# Patient Record
Sex: Male | Born: 2000 | Race: Black or African American | Hispanic: No | Marital: Single | Smoking: Never smoker
Health system: Southern US, Community
[De-identification: ages and names within clinical notes are randomized; demographics above are authoritative.]

## PROBLEM LIST (undated history)

## (undated) DIAGNOSIS — I10 Essential (primary) hypertension: Secondary | ICD-10-CM

## (undated) DIAGNOSIS — E669 Obesity, unspecified: Secondary | ICD-10-CM

---
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
## 2019-11-26 ENCOUNTER — Inpatient Hospital Stay
Admission: EM | Admit: 2019-11-26 | Discharge: 2019-11-28 | DRG: 177 | Disposition: A | Discharge status: Home / Self Care | Payer: Medicaid Other | Attending: Internal Medicine | Admitting: Internal Medicine

## 2019-11-26 ENCOUNTER — Emergency Department: Payer: Medicaid Other

## 2019-11-26 ENCOUNTER — Other Ambulatory Visit: Payer: Self-pay

## 2019-11-26 ENCOUNTER — Encounter: Payer: Self-pay | Admitting: Emergency Medicine

## 2019-11-26 DIAGNOSIS — J9601 Acute respiratory failure with hypoxia: Secondary | ICD-10-CM | POA: Diagnosis present

## 2019-11-26 DIAGNOSIS — J9600 Acute respiratory failure, unspecified whether with hypoxia or hypercapnia: Secondary | ICD-10-CM

## 2019-11-26 DIAGNOSIS — U071 COVID-19: Principal | ICD-10-CM

## 2019-11-26 DIAGNOSIS — D6959 Other secondary thrombocytopenia: Secondary | ICD-10-CM | POA: Diagnosis present

## 2019-11-26 DIAGNOSIS — J1282 Pneumonia due to coronavirus disease 2019: Secondary | ICD-10-CM | POA: Diagnosis present

## 2019-11-26 DIAGNOSIS — E876 Hypokalemia: Secondary | ICD-10-CM | POA: Diagnosis present

## 2019-11-26 DIAGNOSIS — Z6841 Body Mass Index (BMI) 40.0 and over, adult: Secondary | ICD-10-CM

## 2019-11-26 DIAGNOSIS — E669 Obesity, unspecified: Secondary | ICD-10-CM

## 2019-11-26 HISTORY — DX: Essential (primary) hypertension: I10

## 2019-11-26 MED ORDER — SODIUM CHLORIDE 0.9 % IV BOLUS
1000.0000 mL | Freq: Once | INTRAVENOUS | Status: AC
  Administered 2019-11-27: 1000 mL via INTRAVENOUS

## 2019-11-26 MED ORDER — DEXAMETHASONE SODIUM PHOSPHATE 10 MG/ML IJ SOLN
10.0000 mg | Freq: Once | INTRAMUSCULAR | Status: AC
  Administered 2019-11-27: 10 mg via INTRAVENOUS
  Filled 2019-11-26: qty 1

## 2019-11-26 MED ORDER — ONDANSETRON HCL 4 MG/2ML IJ SOLN
4.0000 mg | Freq: Once | INTRAMUSCULAR | Status: AC
  Administered 2019-11-27: 4 mg via INTRAVENOUS
  Filled 2019-11-26: qty 2

## 2019-11-26 NOTE — ED Provider Notes (Signed)
[facility] Emergency Department Provider Note  ____________________________________________   First MD Initiated Contact with Patient 11/26/19 2315     (approximate)  I have reviewed the triage vital signs and the nursing notes.   HISTORY  Chief Complaint Shortness of Breath   HPI Brian Mcknight is a 19 y.o. male with history of hypertension and recently diagnosed COVID-19 infection on 11/23/2019 presents to the emergency department via EMS secondary to progressive dyspnea fever vomiting diarrhea.  EMS states that the patient's oxygen saturation was 97% on room air however that the patient was markedly tachypneic on their arrival.  Patient's amatory saturation 7782% on room air with markedly increased tachypnea.        Past Medical History:  Diagnosis Date  . Hypertension     There are no problems to display for this patient.   Past Surgical History:  Procedure Laterality Date  . FACIAL COSMETIC SURGERY  January 04, 1999   pt st he had a bump on his nose when he was born that was removed    Prior to Admission medications   Not on File    Allergies Patient has no allergy information on record.  History reviewed. No pertinent family history.  Social History Social History   Tobacco Use  . Smoking status: Never Smoker  . Smokeless tobacco: Never Used  Substance Use Topics  . Alcohol use: Never  . Drug use: Never    Review of Systems Constitutional: No fever/chills Eyes: No visual changes. ENT: No sore throat. Cardiovascular: Denies chest pain. Respiratory: Denies shortness of breath. Gastrointestinal: No abdominal pain.  No nausea, no vomiting.  No diarrhea.  No constipation. Genitourinary: Negative for dysuria. Musculoskeletal: Negative for neck pain.  Negative for back pain. Integumentary: Negative for rash. Neurological: Negative for headaches, focal weakness or  numbness.  ____________________________________________   PHYSICAL EXAM:  VITAL SIGNS: ED Triage Vitals  Enc Vitals Group     BP 11/26/19 2303 (!) 109/55     Pulse Rate 11/26/19 2303 (!) 102     Resp 11/26/19 2303 (!) 26     Temp 11/26/19 2303 99.8 F (37.7 C)     Temp Source 11/26/19 2303 Oral     SpO2 11/26/19 2256 100 %     Weight 11/26/19 2304 (!) 172.4 kg (380 lb)     Height 11/26/19 2304 1.829 m (6')     Head Circumference --      Peak Flow --      Pain Score 11/26/19 2303 8     Pain Loc --      Pain Edu? --      Excl. in [place]? --     Constitutional: Alert and oriented.  Apparent respiratory difficulty Eyes: Conjunctivae are normal.  Head: Atraumatic. Mouth/Throat: Patient is wearing a mask. Neck: No stridor.  No meningeal signs.   Cardiovascular: Normal rate, regular rhythm. Good peripheral circulation. Grossly normal heart sounds. Respiratory: Tachypnea.  No retractions.  Diffuse rhonchi  gastrointestinal: Soft and nontender. No distention.  Musculoskeletal: No lower extremity tenderness nor edema. No gross deformities of extremities. Neurologic:  Normal speech and language. No gross focal neurologic deficits are appreciated.  Skin:  Skin is warm, dry and intact. Psychiatric: Mood and affect are normal. Speech and behavior are normal.  ____________________________________________   LABS (all labs ordered are listed, but only abnormal results are displayed)  Labs Reviewed  CULTURE, BLOOD (ROUTINE X 2)  CULTURE, BLOOD (ROUTINE X 2)  LACTIC ACID, PLASMA  LACTIC ACID, PLASMA  CBC WITH DIFFERENTIAL/PLATELET  COMPREHENSIVE METABOLIC PANEL  FIBRIN DERIVATIVES D-DIMER ([facility] ONLY)  PROCALCITONIN  LACTATE DEHYDROGENASE  FERRITIN  TRIGLYCERIDES  FIBRINOGEN  C-REACTIVE PROTEIN   ____________________________________________  EKG  ED ECG REPORT I, [place] N Ivalee Strauser, the attending physician, personally viewed and interpreted this ECG.   Date: 11/27/2019  EKG  Time: 12:18 AM  Rate: 100  Rhythm: Normal sinus rhythm  Axis: Normal  Intervals: Normal  ST&T Change: None  ____________________________________________  RADIOLOGY I, [place] Felicity Penix, personally viewed and evaluated these images (plain radiographs) as part of my medical decision making, as well as reviewing the written report by the radiologist.  ED MD interpretation: No acute cardiopulmonary process noted on chest x-ray per radiologist  Official radiology report(s): DG Chest Port 1 View  Result Date: 11/26/2019 CLINICAL DATA:  19 year old male with cough and shortness of breath. EXAM: PORTABLE CHEST 1 VIEW COMPARISON:  None. FINDINGS: No focal consolidation, pleural effusion, or pneumothorax. There is shallow inspiration. Borderline cardiomegaly. No acute osseous pathology. IMPRESSION: No acute cardiopulmonary process. Electronically Signed   By: Elgie Collard M.D.   On: 11/26/2019 23:38    ____________________________________________   PROCEDURES   Procedure(s) performed (including Critical Care):  .Critical Care Performed by: Darci Current, MD Authorized by: Darci Current, MD   Critical care provider statement:    Critical care time (minutes):  30   Critical care time was exclusive of:  Separately billable procedures and treating other patients   Critical care was necessary to treat or prevent imminent or life-threatening deterioration of the following conditions:  Respiratory failure   Critical care was time spent personally by me on the following activities:  Development of treatment plan with patient or surrogate, discussions with consultants, evaluation of patient's response to treatment, examination of patient, obtaining history from patient or surrogate, ordering and performing treatments and interventions, ordering and review of laboratory studies, ordering and review of radiographic studies, pulse oximetry, re-evaluation of patient's condition and review of  old charts     ____________________________________________   INITIAL IMPRESSION / MDM / ASSESSMENT AND PLAN / ED COURSE  As part of my medical decision making, I reviewed the following data within the electronic MEDICAL RECORD NUMBER   19 year old male presented with above-stated history review of system with differential diagnosis including but not limited to COVID-19 infection, COVID-19 pneumonia, superimposed bacterial pneumonia.  Chest x-ray revealed no findings of pneumonia.  Given patient's marked hypoxia with minimal exertion.  Patient given Decadron 10 mg IV remdesivir consult placed.  Patient placed on 3 L oxygen via nasal cannula.  On reevaluation patient's work of breathing decreased resting comfortably.  Patient discussed with Dr. Para March hospitalist for admission for further evaluation and management. ____________________________________________  FINAL CLINICAL IMPRESSION(S) / ED DIAGNOSES  Final diagnoses:  COVID-19 virus infection     MEDICATIONS GIVEN DURING THIS VISIT:  Medications  sodium chloride 0.9 % bolus 1,000 mL (has no administration in time range)  dexamethasone (DECADRON) injection 10 mg (10 mg Intravenous Given 11/27/19 0021)  ondansetron (ZOFRAN) injection 4 mg (4 mg Intravenous Given 11/27/19 0021)     ED Discharge Orders    None      *Please note:  Zebulun Deman was evaluated in Emergency Department on 11/27/2019 for the symptoms described in the history of present illness. He was evaluated in the context of the global COVID-19 pandemic, which necessitated consideration that the patient might be at risk for infection with the SARS-CoV-2  virus that causes COVID-19. Institutional protocols and algorithms that pertain to the evaluation of patients at risk for COVID-19 are in a state of rapid change based on information released by regulatory bodies including the CDC and federal and state organizations. These policies and algorithms were followed during the  patient's care in the ED.  Some ED evaluations and interventions may be delayed as a result of limited staffing during the pandemic.*  Note:  This document was prepared using Dragon voice recognition software and may include unintentional dictation errors.   Darci Current, MD 11/27/19 [phone]

## 2019-11-26 NOTE — ED Notes (Signed)
Pt was ambulated with cardiac and pulse oximetry monitoring and de stated to 77-82% O2 on RA. EDP notified

## 2019-11-26 NOTE — ED Triage Notes (Addendum)
Pt from home to ED via [facility] for c/o [facility] r/t COVID dx. {Pt was dx w/ COVID 5/1 and has since had increasing dififculty while breathing. Pt able to speak in complete sentences but labored at rest with prolonged exhalation.   PT A&Ox4  T: 103.1 P:104 BP: 140/70 O2: 100 RA   975mg  of tylenol oral given en route  Pmhx: htn( Pt takes amlodipine but has stopped since being dx with COVID - st "I feel too bad to take it" )

## 2019-11-26 NOTE — ED Notes (Signed)
EDP at bedside  

## 2019-11-27 DIAGNOSIS — J9600 Acute respiratory failure, unspecified whether with hypoxia or hypercapnia: Secondary | ICD-10-CM

## 2019-11-27 DIAGNOSIS — R0600 Dyspnea, unspecified: Secondary | ICD-10-CM | POA: Diagnosis present

## 2019-11-27 DIAGNOSIS — J9601 Acute respiratory failure with hypoxia: Secondary | ICD-10-CM | POA: Diagnosis present

## 2019-11-27 DIAGNOSIS — D6959 Other secondary thrombocytopenia: Secondary | ICD-10-CM | POA: Diagnosis present

## 2019-11-27 DIAGNOSIS — Z6841 Body Mass Index (BMI) 40.0 and over, adult: Secondary | ICD-10-CM

## 2019-11-27 DIAGNOSIS — J1282 Pneumonia due to coronavirus disease 2019: Secondary | ICD-10-CM | POA: Diagnosis present

## 2019-11-27 DIAGNOSIS — U071 COVID-19: Principal | ICD-10-CM

## 2019-11-27 DIAGNOSIS — D696 Thrombocytopenia, unspecified: Secondary | ICD-10-CM

## 2019-11-27 DIAGNOSIS — E876 Hypokalemia: Secondary | ICD-10-CM | POA: Diagnosis present

## 2019-11-27 LAB — LACTATE DEHYDROGENASE: LDH: 239 U/L — ABNORMAL HIGH (ref 98–192)

## 2019-11-27 LAB — COMPREHENSIVE METABOLIC PANEL
ALT: 48 U/L — ABNORMAL HIGH (ref 0–44)
AST: 31 U/L (ref 15–41)
Albumin: 4.2 g/dL (ref 3.5–5.0)
Alkaline Phosphatase: 43 U/L (ref 38–126)
Anion gap: 9 (ref 5–15)
BUN: 12 mg/dL (ref 6–20)
CO2: 23 mmol/L (ref 22–32)
Calcium: 8.9 mg/dL (ref 8.9–10.3)
Chloride: 102 mmol/L (ref 98–111)
Creatinine, Ser: 1.09 mg/dL (ref 0.61–1.24)
GFR calc Af Amer: >60 mL/min (ref >60)
GFR calc non Af Amer: >60 mL/min (ref >60)
Glucose, Bld: 92 mg/dL (ref 70–99)
Potassium: 3.3 mmol/L — ABNORMAL LOW (ref 3.5–5.1)
Sodium: 134 mmol/L — ABNORMAL LOW (ref 135–145)
Total Bilirubin: 1.3 mg/dL — ABNORMAL HIGH (ref 0.3–1.2)
Total Protein: 8 g/dL (ref 6.5–8.1)

## 2019-11-27 LAB — CBC WITH DIFFERENTIAL/PLATELET
Abs Immature Granulocytes: 0.02 10*3/uL (ref 0.00–0.07)
Basophils Absolute: 0 10*3/uL (ref 0.0–0.1)
Basophils Relative: 1 %
Eosinophils Absolute: 0 10*3/uL (ref 0.0–0.5)
Eosinophils Relative: 0 %
HCT: 41.5 % (ref 39.0–52.0)
Hemoglobin: 14.5 g/dL (ref 13.0–17.0)
Immature Granulocytes: 1 %
Lymphocytes Relative: 30 %
Lymphs Abs: 1.3 10*3/uL (ref 0.7–4.0)
MCH: 31.3 pg (ref 26.0–34.0)
MCHC: 34.9 g/dL (ref 30.0–36.0)
MCV: 89.4 fL (ref 80.0–100.0)
Monocytes Absolute: 0.4 10*3/uL (ref 0.1–1.0)
Monocytes Relative: 9 %
Neutro Abs: 2.6 10*3/uL (ref 1.7–7.7)
Neutrophils Relative %: 59 %
Platelets: 116 10*3/uL — ABNORMAL LOW (ref 150–400)
RBC: 4.64 MIL/uL (ref 4.22–5.81)
RDW: 12.1 % (ref 11.5–15.5)
WBC: 4.3 10*3/uL (ref 4.0–10.5)
nRBC: 0 % (ref 0.0–0.2)

## 2019-11-27 LAB — C-REACTIVE PROTEIN: CRP: 1.4 mg/dL — ABNORMAL HIGH (ref <1.0)

## 2019-11-27 LAB — LACTIC ACID, PLASMA
Lactic Acid, Venous: 0.8 mmol/L (ref 0.5–1.9)
Lactic Acid, Venous: 1 mmol/L (ref 0.5–1.9)

## 2019-11-27 LAB — FERRITIN: Ferritin: 161 ng/mL (ref 24–336)

## 2019-11-27 LAB — TRIGLYCERIDES: Triglycerides: 61 mg/dL (ref <150)

## 2019-11-27 LAB — ABO/RH: ABO/RH(D): A POS

## 2019-11-27 LAB — FIBRIN DERIVATIVES D-DIMER (ARMC ONLY): Fibrin derivatives D-dimer (ARMC): 540.55 ng/mL (FEU) — ABNORMAL HIGH (ref 0.00–499.00)

## 2019-11-27 LAB — FIBRINOGEN: Fibrinogen: 390 mg/dL (ref 210–475)

## 2019-11-27 LAB — PROCALCITONIN: Procalcitonin: <0.1 ng/mL

## 2019-11-27 LAB — POC SARS CORONAVIRUS 2 AG: SARS Coronavirus 2 Ag: POSITIVE — AB

## 2019-11-27 MED ORDER — DEXAMETHASONE SODIUM PHOSPHATE 10 MG/ML IJ SOLN
6.0000 mg | INTRAMUSCULAR | Status: DC
  Administered 2019-11-27 – 2019-11-28 (×2): 6 mg via INTRAVENOUS
  Filled 2019-11-27 (×3): qty 1

## 2019-11-27 MED ORDER — SODIUM CHLORIDE 0.9 % IV SOLN
200.0000 mg | Freq: Once | INTRAVENOUS | Status: AC
  Administered 2019-11-27: 200 mg via INTRAVENOUS
  Filled 2019-11-27: qty 40

## 2019-11-27 MED ORDER — ZINC SULFATE 220 (50 ZN) MG PO CAPS
220.0000 mg | ORAL_CAPSULE | Freq: Every day | ORAL | Status: DC
  Administered 2019-11-27 – 2019-11-28 (×2): 220 mg via ORAL
  Filled 2019-11-27 (×2): qty 1

## 2019-11-27 MED ORDER — POTASSIUM CHLORIDE CRYS ER 20 MEQ PO TBCR
20.0000 meq | EXTENDED_RELEASE_TABLET | Freq: Once | ORAL | Status: AC
  Administered 2019-11-27: 20 meq via ORAL
  Filled 2019-11-27: qty 1

## 2019-11-27 MED ORDER — GUAIFENESIN-DM 100-10 MG/5ML PO SYRP
10.0000 mL | ORAL_SOLUTION | ORAL | Status: DC | PRN
  Filled 2019-11-27: qty 10

## 2019-11-27 MED ORDER — ASCORBIC ACID 500 MG PO TABS
500.0000 mg | ORAL_TABLET | Freq: Every day | ORAL | Status: DC
  Administered 2019-11-27 – 2019-11-28 (×2): 500 mg via ORAL
  Filled 2019-11-27 (×2): qty 1

## 2019-11-27 MED ORDER — ALBUTEROL SULFATE HFA 108 (90 BASE) MCG/ACT IN AERS
2.0000 | INHALATION_SPRAY | Freq: Four times a day (QID) | RESPIRATORY_TRACT | Status: DC
  Administered 2019-11-27 – 2019-11-28 (×6): 2 via RESPIRATORY_TRACT
  Filled 2019-11-27: qty 6.7

## 2019-11-27 MED ORDER — HYDROCOD POLST-CPM POLST ER 10-8 MG/5ML PO SUER
5.0000 mL | Freq: Two times a day (BID) | ORAL | Status: DC | PRN
  Administered 2019-11-27: 5 mL via ORAL
  Filled 2019-11-27: qty 5

## 2019-11-27 MED ORDER — SODIUM CHLORIDE 0.9 % IV SOLN
100.0000 mg | Freq: Every day | INTRAVENOUS | Status: DC
  Filled 2019-11-27: qty 20

## 2019-11-27 MED ORDER — ENOXAPARIN SODIUM 40 MG/0.4ML ~~LOC~~ SOLN
40.0000 mg | Freq: Two times a day (BID) | SUBCUTANEOUS | Status: DC
  Administered 2019-11-27 – 2019-11-28 (×3): 40 mg via SUBCUTANEOUS
  Filled 2019-11-27 (×4): qty 0.4

## 2019-11-27 MED ORDER — ADULT MULTIVITAMIN W/MINERALS CH
1.0000 | ORAL_TABLET | Freq: Every day | ORAL | Status: DC
  Administered 2019-11-27 – 2019-11-28 (×2): 1 via ORAL
  Filled 2019-11-27 (×2): qty 1

## 2019-11-27 NOTE — H&P (Signed)
History and Physical    Brian Mcknight TOI:[id number] DOB: 2000-09-11 DOA: 11/26/2019  PCP: System, Pcp Not In   Patient coming from: home I have personally briefly reviewed patient's old medical records in [facility]  Chief Complaint: shortness of breath  HPI: Brian Mcknight is a 19 y.o. male with medical history significant for morbid obesity, diagnosed with Covid on 11/23/2019 who presents to the emergency room with a complaint of  dyspnea with exertion.  Patient states he started feeling unwell about with a cough and shortness of breath and went to get tested.  The test was positive but he states he had minimal symptoms at the time.  He said he started developing shortness of breath just after he was tested positive in the past progress since.  He also developed a fever and cough 1 day.  EMS reported a temperature of 103.1 and he received IV Tylenol  ED Course: On arrival he had a low-grade temperature of 99.8 tachycardia of 102, tachypnea of 26.  O2 sat 97% on room air at rest but desaturates to 77 with minimal exertion like getting off the bed.  D-dimer elevated at 540 and LDH 239.  CBC pending at admission.  Lactic acid 0.8.  Chest x-ray showed no acute disease.  Patient started on Decadron and hospitalist consulted.   Review of Systems: As per HPI otherwise 10 point review of systems negative.    Past Medical History:  Diagnosis Date  . Hypertension     Past Surgical History:  Procedure Laterality Date  . FACIAL COSMETIC SURGERY  1998-12-25   pt st he had a bump on his nose when he was born that was removed     reports that he has never smoked. He has never used smokeless tobacco. He reports that he does not drink alcohol or use drugs.  Not on File  History reviewed. No pertinent family history.   Prior to Admission medications   Not on File    Physical Exam: Vitals:   11/26/19 2256 11/26/19 2302 11/26/19 2303 11/26/19 2304  BP:   (!) 109/55   Pulse:   (!) 102     Resp:   (!) 26   Temp:   99.8 F (37.7 C)   TempSrc:   Oral   SpO2: 100% 96% 97%   Weight:    (!) 172.4 kg  Height:    6' (1.829 m)     Vitals:   11/26/19 2256 11/26/19 2302 11/26/19 2303 11/26/19 2304  BP:   (!) 109/55   Pulse:   (!) 102   Resp:   (!) 26   Temp:   99.8 F (37.7 C)   TempSrc:   Oral   SpO2: 100% 96% 97%   Weight:    (!) 172.4 kg  Height:    6' (1.829 m)    Constitutional: Alert and awake, oriented x3, not in any acute distress. Eyes: PERLA, EOMI, irises appear normal, anicteric sclera,  ENMT: external ears and nose appear normal, normal hearing             Lips appears normal, oropharynx mucosa, tongue, posterior pharynx appear normal  Neck: neck appears normal, no masses, normal ROM, no thyromegaly, no JVD  CVS: S1-S2 clear, no murmur rubs or gallops,  , no carotid bruits, pedal pulses palpable, No LE edema Respiratory:  clear to auscultation bilaterally, no wheezing, rales or rhonchi. Respiratory effort normal. No accessory muscle use.  Abdomen: soft nontender, nondistended, normal bowel  sounds, no hepatosplenomegaly, no hernias Musculoskeletal: : no cyanosis, clubbing , no contractures or atrophy Neuro: Cranial nerves II-XII intact, sensation, reflexes normal, strength Psych: judgement and insight appear normal, stable mood and affect,  Skin: no rashes or lesions or ulcers, no induration or nodules   Labs on Admission: I have personally reviewed following labs and imaging studies  CBC: No results for input(s): WBC, NEUTROABS, HGB, HCT, MCV, PLT in the last 168 hours. Basic Metabolic Panel: Recent Labs  Lab 11/27/19 0004  NA 134*  K 3.3*  CL 102  CO2 23  GLUCOSE 92  BUN 12  CREATININE 1.09  CALCIUM 8.9   GFR: Estimated Creatinine Clearance: 176.6 mL/min (by C-G formula based on SCr of 1.09 mg/dL). Liver Function Tests: Recent Labs  Lab 11/27/19 0004  AST 31  ALT 48*  ALKPHOS 43  BILITOT 1.3*  PROT 8.0  ALBUMIN 4.2   No results  for input(s): LIPASE, AMYLASE in the last 168 hours. No results for input(s): AMMONIA in the last 168 hours. Coagulation Profile: No results for input(s): INR, PROTIME in the last 168 hours. Cardiac Enzymes: No results for input(s): CKTOTAL, CKMB, CKMBINDEX, TROPONINI in the last 168 hours. BNP (last 3 results) No results for input(s): PROBNP in the last 8760 hours. HbA1C: No results for input(s): HGBA1C in the last 72 hours. CBG: No results for input(s): GLUCAP in the last 168 hours. Lipid Profile: Recent Labs    11/27/19 0004  TRIG 61   Thyroid Function Tests: No results for input(s): TSH, T4TOTAL, FREET4, T3FREE, THYROIDAB in the last 72 hours. Anemia Panel: Recent Labs    11/27/19 0004  FERRITIN 161   Urine analysis: No results found for: COLORURINE, APPEARANCEUR, LABSPEC, PHURINE, GLUCOSEU, HGBUR, BILIRUBINUR, KETONESUR, PROTEINUR, UROBILINOGEN, NITRITE, LEUKOCYTESUR  Radiological Exams on Admission: DG Chest Port 1 View  Result Date: 11/26/2019 CLINICAL DATA:  19 year old male with cough and shortness of breath. EXAM: PORTABLE CHEST 1 VIEW COMPARISON:  None. FINDINGS: No focal consolidation, pleural effusion, or pneumothorax. There is shallow inspiration. Borderline cardiomegaly. No acute osseous pathology. IMPRESSION: No acute cardiopulmonary process. Electronically Signed   By: Elgie Collard M.D.   On: 11/26/2019 23:38    EKG: Independently reviewed.   Assessment/Plan Active Problems:   Acute respiratory failure due to COVID-19 [facility]) Suspect COVID-19 pneumonia -Diagnosed with COVID-19 on 11/23/2019 at a [facility].   -Presents with shortness of breath, fever and cough.  O2 sat dropped to the 70s with ambulation in the ER with elevated D-dimer 549 LDH 239.  Chest x-ray with no acute disease -patient is symptomatic for pneumonia though no disease on CXR -IV Decadron, remdesivir, albuterol inhaler, antitussives and vitamins. -Proning per protocol if  tolerated -Supplemental oxygen to keep sats over 94%    Morbid obesity with BMI of 50.0-59.9, adult (HCC) -Complicates overall prognosis and care    DVT prophylaxis: Lovenox  Code Status: full code  Family Communication:  none  Disposition Plan: Back to previous home environment Consults called: none  Status:inp   The appropriate patient status for this patient is INPATIENT. Inpatient status is judged to be reasonable and necessary in order to provide the required intensity of service to ensure the patient's safety. The patient's presenting symptoms, physical exam findings, and initial radiographic and laboratory data in the context of their chronic comorbidities is felt to place them at high risk for further clinical deterioration. Furthermore, it is not anticipated that the patient will be medically stable for discharge from the  hospital within 2 midnights of admission. The following factors support the patient status of inpatient.    " Presenting symptoms: Shortness of breath with  Hypoxia, O2 sat in the 70s in the setting of Covid positivity"            Worrisome physical exam: Tachypnea, tachycardia and fever with O2 sats in the 70s.  " The chronic co-morbidities include: Morbid obesity with BMI of 51 negatively impacting his prognosis    * I certify that at the point of admission it is my clinical judgment that the patient will require inpatient hospital care spanning beyond 2 midnights from the point of admission due to high intensity of service,  risk for further deterioration and high frequency of surveillance required.     Athena Masse MD [facility]     11/27/2019, 2:34 AM

## 2019-11-27 NOTE — Progress Notes (Signed)
PROGRESS NOTE    Brian Mcknight  SFK:[id number] DOB: 2000-08-07 DOA: 11/26/2019 PCP: System, Pcp Not In     Assessment & Plan:   Active Problems:   Acute respiratory failure due to COVID-19 [facility])   Morbid obesity with BMI of 50.0-59.9, adult (HCC)   Acute respiratory failure with hypoxia (HCC)  COVID19 pneumonia: dx on 11/23/2019 at a [facility]. Continue IV Decadron, remdesivir,vitamin c & zinc. Continue on bronchodilators. Encourage incentive spirometry & flutter valve.   Morbid obesity:  BMI of 51.5. Complicates overall prognosis and care. Dietitian consulted. Exercise counseling   Hypokalemia: KCl repleted. Will continue to monitor  Thrombocytopenia: likely secondary to COVID19. Will continue to monitor    DVT prophylaxis: lovenox  Code Status: full  Family Communication:  Disposition Plan: likely will d/c back home    Consultants:     Procedures:   Antimicrobials:    Subjective: Pt c/o shortness of breath but improved from day prior.   Objective: Vitals:   11/27/19 0100 11/27/19 0200 11/27/19 0300 11/27/19 0645  BP:   (!) 144/75 (!) 106/57  Pulse: 94 83 77 72  Resp: 18 (!) 24 (!) 23 (!) 22  Temp:   98.7 F (37.1 C)   TempSrc:   Oral   SpO2: 99% 94% 95% 94%  Weight:      Height:       No intake or output data in the 24 hours ending 11/27/19 0852 Filed Weights   11/26/19 2304  Weight: (!) 172.4 kg    Examination:  General exam: Appears calm and comfortable  Respiratory system: diminished breath sounds b/l. No wheezes, rales Cardiovascular system: S1 & S2 +. No rubs, gallops or clicks. B/l LE edema Gastrointestinal system: Abdomen is obese, soft and nontender. Hypoactive bowel sounds heard. Central nervous system: Alert and oriented. Moves all 4 extremities  Psychiatry: Judgement and insight appear normal. Mood & affect appropriate.     Data Reviewed: I have personally reviewed following labs and imaging studies  CBC: Recent Labs  Lab  11/27/19 0004  WBC 4.3  NEUTROABS 2.6  HGB 14.5  HCT 41.5  MCV 89.4  PLT 116*   Basic Metabolic Panel: Recent Labs  Lab 11/27/19 0004  NA 134*  K 3.3*  CL 102  CO2 23  GLUCOSE 92  BUN 12  CREATININE 1.09  CALCIUM 8.9   GFR: Estimated Creatinine Clearance: 176.6 mL/min (by C-G formula based on SCr of 1.09 mg/dL). Liver Function Tests: Recent Labs  Lab 11/27/19 0004  AST 31  ALT 48*  ALKPHOS 43  BILITOT 1.3*  PROT 8.0  ALBUMIN 4.2   No results for input(s): LIPASE, AMYLASE in the last 168 hours. No results for input(s): AMMONIA in the last 168 hours. Coagulation Profile: No results for input(s): INR, PROTIME in the last 168 hours. Cardiac Enzymes: No results for input(s): CKTOTAL, CKMB, CKMBINDEX, TROPONINI in the last 168 hours. BNP (last 3 results) No results for input(s): PROBNP in the last 8760 hours. HbA1C: No results for input(s): HGBA1C in the last 72 hours. CBG: No results for input(s): GLUCAP in the last 168 hours. Lipid Profile: Recent Labs    11/27/19 0004  TRIG 61   Thyroid Function Tests: No results for input(s): TSH, T4TOTAL, FREET4, T3FREE, THYROIDAB in the last 72 hours. Anemia Panel: Recent Labs    11/27/19 0004  FERRITIN 161   Sepsis Labs: Recent Labs  Lab 11/27/19 0004 11/27/19 0046  PROCALCITON <0.10  --   LATICACIDVEN 1.0  0.8    Recent Results (from the past 240 hour(s))  Blood Culture (routine x 2)     Status: None (Preliminary result)   Collection Time: 11/27/19 12:04 AM   Specimen: BLOOD  Result Value Ref Range Status   Specimen Description BLOOD BLOOD RIGHT HAND  Final   Special Requests   Final    BOTTLES DRAWN AEROBIC AND ANAEROBIC Blood Culture adequate volume   Culture   Final    NO GROWTH < 12 HOURS Performed at [facility], [address] [place], [place] [address]    Report Status PENDING  Incomplete  Blood Culture (routine x 2)     Status: None (Preliminary result)   Collection Time:  11/27/19 12:04 AM   Specimen: Left Antecubital; Blood  Result Value Ref Range Status   Specimen Description LEFT ANTECUBITAL  Final   Special Requests   Final    BOTTLES DRAWN AEROBIC AND ANAEROBIC Blood Culture results may not be optimal due to an excessive volume of blood received in culture bottles   Culture   Final    NO GROWTH < 12 HOURS Performed at [facility], [address], [place], [place] [address]    Report Status PENDING  Incomplete         Radiology Studies: DG Chest Port 1 View  Result Date: 11/26/2019 CLINICAL DATA:  19 year old male with cough and shortness of breath. EXAM: PORTABLE CHEST 1 VIEW COMPARISON:  None. FINDINGS: No focal consolidation, pleural effusion, or pneumothorax. There is shallow inspiration. Borderline cardiomegaly. No acute osseous pathology. IMPRESSION: No acute cardiopulmonary process. Electronically Signed   By: Anner Crete M.D.   On: 11/26/2019 23:38        Scheduled Meds: . albuterol  2 puff Inhalation Q6H  . vitamin C  500 mg Oral Daily  . dexamethasone (DECADRON) injection  6 mg Intravenous Q24H  . enoxaparin (LOVENOX) injection  40 mg Subcutaneous BID  . multivitamin with minerals  1 tablet Oral Daily  . zinc sulfate  220 mg Oral Daily   Continuous Infusions: . [START ON 11/28/2019] remdesivir 100 mg in NS 100 mL       LOS: 0 days    Time spent: 35 mins     Wyvonnia Dusky, MD [facility] Pager [phone]  If 7PM-7AM, please contact night-coverage [email or web address] 11/27/2019, 8:52 AM

## 2019-11-27 NOTE — ED Notes (Signed)
Pt provided with breakfast

## 2019-11-27 NOTE — ED Notes (Signed)
MD at bedside. 

## 2019-11-27 NOTE — ED Notes (Signed)
Pt ambulatory to toilet independently w/a steady gait. NAD by this RN

## 2019-11-27 NOTE — Progress Notes (Signed)
Remdesivir - Pharmacy Brief Note   O:  CXR: IMPRESSION: No acute cardiopulmonary process. SpO2: 95 - 100% on RA   A/P:  Remdesivir 200 mg IVPB once followed by 100 mg IVPB daily x 4 days.   Thomasene Ripple, PharmD, BCPS Clinical Pharmacist 11/27/2019 3:44 AM

## 2019-11-27 NOTE — ED Notes (Signed)
Pt up independently to restroom. Pt endorses some mild SOB but much less than yesterday.

## 2019-11-28 LAB — COMPREHENSIVE METABOLIC PANEL
ALT: 40 U/L (ref 0–44)
AST: 21 U/L (ref 15–41)
Albumin: 4 g/dL (ref 3.5–5.0)
Alkaline Phosphatase: 43 U/L (ref 38–126)
Anion gap: 8 (ref 5–15)
BUN: 13 mg/dL (ref 6–20)
CO2: 26 mmol/L (ref 22–32)
Calcium: 8.9 mg/dL (ref 8.9–10.3)
Chloride: 104 mmol/L (ref 98–111)
Creatinine, Ser: 0.95 mg/dL (ref 0.61–1.24)
GFR calc Af Amer: >60 mL/min (ref >60)
GFR calc non Af Amer: >60 mL/min (ref >60)
Glucose, Bld: 113 mg/dL — ABNORMAL HIGH (ref 70–99)
Potassium: 4 mmol/L (ref 3.5–5.1)
Sodium: 138 mmol/L (ref 135–145)
Total Bilirubin: 1 mg/dL (ref 0.3–1.2)
Total Protein: 8.2 g/dL — ABNORMAL HIGH (ref 6.5–8.1)

## 2019-11-28 LAB — CBC WITH DIFFERENTIAL/PLATELET
Abs Immature Granulocytes: 0.03 10*3/uL (ref 0.00–0.07)
Basophils Absolute: 0 10*3/uL (ref 0.0–0.1)
Basophils Relative: 0 %
Eosinophils Absolute: 0 10*3/uL (ref 0.0–0.5)
Eosinophils Relative: 0 %
HCT: 42.3 % (ref 39.0–52.0)
Hemoglobin: 14.3 g/dL (ref 13.0–17.0)
Immature Granulocytes: 1 %
Lymphocytes Relative: 19 %
Lymphs Abs: 1.2 10*3/uL (ref 0.7–4.0)
MCH: 31.3 pg (ref 26.0–34.0)
MCHC: 33.8 g/dL (ref 30.0–36.0)
MCV: 92.6 fL (ref 80.0–100.0)
Monocytes Absolute: 0.4 10*3/uL (ref 0.1–1.0)
Monocytes Relative: 6 %
Neutro Abs: 4.9 10*3/uL (ref 1.7–7.7)
Neutrophils Relative %: 74 %
Platelets: 129 10*3/uL — ABNORMAL LOW (ref 150–400)
RBC: 4.57 MIL/uL (ref 4.22–5.81)
RDW: 11.9 % (ref 11.5–15.5)
WBC: 6.6 10*3/uL (ref 4.0–10.5)
nRBC: 0 % (ref 0.0–0.2)

## 2019-11-28 LAB — FERRITIN: Ferritin: 161 ng/mL (ref 24–336)

## 2019-11-28 LAB — C-REACTIVE PROTEIN: CRP: 1.1 mg/dL — ABNORMAL HIGH (ref <1.0)

## 2019-11-28 LAB — HIV ANTIBODY (ROUTINE TESTING W REFLEX): HIV Screen 4th Generation wRfx: NONREACTIVE

## 2019-11-28 LAB — FIBRIN DERIVATIVES D-DIMER (ARMC ONLY): Fibrin derivatives D-dimer (ARMC): 425.53 ng/mL (FEU) (ref 0.00–499.00)

## 2019-11-28 MED ORDER — ONDANSETRON HCL 4 MG/2ML IJ SOLN
4.0000 mg | Freq: Four times a day (QID) | INTRAMUSCULAR | Status: DC | PRN
  Administered 2019-11-28: 06:00:00 4 mg via INTRAVENOUS
  Filled 2019-11-28: qty 2

## 2019-11-28 MED ORDER — DEXAMETHASONE 6 MG PO TABS
6.0000 mg | ORAL_TABLET | Freq: Every day | ORAL | 0 refills | Status: AC
Start: 2019-11-28 — End: 2019-12-05

## 2019-11-28 MED ORDER — ALBUTEROL SULFATE HFA 108 (90 BASE) MCG/ACT IN AERS: 2.0000 | INHALATION_SPRAY | Freq: Four times a day (QID) | RESPIRATORY_TRACT | 0 refills | Status: AC | PRN

## 2019-11-28 NOTE — Discharge Summary (Signed)
Physician Discharge Summary  Brian Brian Mcknight KGU:[id number] DOB: Jul 01, 2001 DOA: 11/26/2019  PCP: System, Pcp Not In  Admit date: 11/26/2019 Discharge date: 11/28/2019  Admitted From: home Disposition:  home  Recommendations for Outpatient Follow-up:  1. Follow up with PCP in 2 weeks  Home Health: no Equipment/Devices:  Discharge Condition: stable CODE STATUS: full  Diet recommendation: Carb Modified  Brief/Interim Summary: HPI was taken from Dr. Damita Brian Mcknight: Brian Brian Mcknight is a 19 y.o. male with medical history significant for morbid obesity, diagnosed with Covid on 11/23/2019 who presents to the emergency room with a complaint of  dyspnea with exertion.  Patient states he started feeling unwell about with a cough and shortness of breath and went to get tested.  The test was positive but he states he had minimal symptoms at the time.  He said he started developing shortness of breath just after he was tested positive in the past progress since.  He also developed a fever and cough 1 day.  EMS reported a temperature of 103.1 and he received IV Tylenol  ED Course: On arrival he had a low-grade temperature of 99.8 tachycardia of 102, tachypnea of 26.  O2 sat 97% on room air at rest but desaturates to 77 with minimal exertion like getting off the bed.  D-dimer elevated at 540 and LDH 239.  CBC pending at admission.  Lactic acid 0.8.  Chest x-ray showed no acute disease.  Patient started on Decadron and hospitalist consulted.    Hospital Course from Dr. Lenise Herald 5/5-11/28/19: Pt was found to have COVID19 pneumonia and was treated w/ IV remdesivir, decadron, vitamin c, zinc, bronchodilators & incentive spirometry. Pt did not require supplemental oxygen while inpatient. Of note, pt is morbidly obese and it was discussed w/ the importance of losing weight. Pt verbalized his understanding and stated he recently lost 15lbs.     Discharge Diagnoses:  Active Problems:   Acute respiratory failure due to  COVID-19 [facility])   Morbid obesity with BMI of 50.0-59.9, adult (HCC)   Acute respiratory failure with hypoxia ([place])  COVID19 pneumonia: dx on 11/23/2019 at a [facility]. Continue IV decadron, remdesivir,vitamin c & zinc. Continue on bronchodilators. Encourage incentive spirometry & flutter valve.   Morbid obesity:  BMI of 51.5. Complicates overall prognosis and care. Dietitian consulted. Exercise counseling   Hypokalemia: WNL today. Will continue to monitor  Thrombocytopenia: likely secondary to [place]. Will continue to monitor   Discharge Instructions  Discharge Instructions    Diet - low sodium heart healthy   Complete by: As directed    Discharge instructions   Complete by: As directed    F/u PCP in 2 weeks; Must quarantine for 14 days total since being diagnosed w/ COVID19   Increase activity slowly   Complete by: As directed      Allergies as of 11/28/2019   Not on File     Medication List    TAKE these medications   albuterol 108 (90 Base) MCG/ACT inhaler Commonly known as: VENTOLIN HFA Inhale 2 puffs into the lungs every 6 (six) hours as needed for wheezing or shortness of breath.   amLODipine 5 MG tablet Commonly known as: NORVASC Take 5 mg by mouth daily.   dexamethasone 6 MG tablet Commonly known as: DECADRON Take 1 tablet (6 mg total) by mouth daily for 7 days.   ibuprofen 800 MG tablet Commonly known as: ADVIL Take 800 mg by mouth every 6 (six) hours as needed for pain.   Vitamin  D (Ergocalciferol) 1.25 MG (50000 UNIT) Caps capsule Commonly known as: DRISDOL Take 50,000 Units by mouth once a week.       Not on File  Consultations:   Procedures/Studies: DG Chest Port 1 View  Result Date: 11/26/2019 CLINICAL DATA:  19 year old male with cough and shortness of breath. EXAM: PORTABLE CHEST 1 VIEW COMPARISON:  None. FINDINGS: No focal consolidation, pleural effusion, or pneumothorax. There is shallow inspiration. Borderline cardiomegaly. No acute  osseous pathology. IMPRESSION: No acute cardiopulmonary process. Electronically Signed   By: Brian Brian Mcknight M.D.   On: 11/26/2019 23:38     Subjective: Pt denies any complaints    Discharge Exam: Vitals:   11/27/19 2347 11/28/19 0809  BP: (!) 161/88 134/90  Pulse: 98 87  Resp:  (!) 21  Temp: 97.6 F (36.4 C) 98.3 F (36.8 C)  SpO2: 100% 96%   Vitals:   11/27/19 1619 11/27/19 1710 11/27/19 2347 11/28/19 0809  BP: (!) 145/79 139/75 (!) 161/88 134/90  Pulse: 68 85 98 87  Resp: (!) 22 20  (!) 21  Temp: 98.4 F (36.9 C) 98.5 F (36.9 C) 97.6 F (36.4 C) 98.3 F (36.8 C)  TempSrc: Oral Oral Oral Oral  SpO2: 94% 99% 100% 96%  Weight:      Height:        General: Pt is alert, awake, not in acute distress Cardiovascular:S1/S2 +, no rubs, no gallops Respiratory:diminished breath sounds b/l otherwise clear Abdominal: Soft, NT, obese, bowel sounds + Extremities:  no cyanosis    The results of significant diagnostics from this hospitalization (including imaging, microbiology, ancillary and laboratory) are listed below for reference.     Microbiology: Recent Results (from the past 240 hour(s))  Blood Culture (routine x 2)     Status: None (Preliminary result)   Collection Time: 11/27/19 12:04 AM   Specimen: BLOOD  Result Value Ref Brian Mcknight Status   Specimen Description BLOOD BLOOD RIGHT HAND  Final   Special Requests   Final    BOTTLES DRAWN AEROBIC AND ANAEROBIC Blood Culture adequate volume   Culture   Final    NO GROWTH 1 DAY Performed at [facility], [address], [place], [place] [address]    Report Status PENDING  Incomplete  Blood Culture (routine x 2)     Status: None (Preliminary result)   Collection Time: 11/27/19 12:04 AM   Specimen: Left Antecubital; Blood  Result Value Ref Brian Mcknight Status   Specimen Description LEFT ANTECUBITAL  Final   Special Requests   Final    BOTTLES DRAWN AEROBIC AND ANAEROBIC Blood Culture results may not be optimal due  to an excessive volume of blood received in culture bottles   Culture   Final    NO GROWTH 1 DAY Performed at [facility], [address], [place], [place] [address]    Report Status PENDING  Incomplete     Labs: BNP (last 3 results) No results for input(s): BNP in the last 8760 hours. Basic Metabolic Panel: Recent Labs  Lab 11/27/19 0004 11/28/19 0603  NA 134* 138  K 3.3* 4.0  CL 102 104  CO2 23 26  GLUCOSE 92 113*  BUN 12 13  CREATININE 1.09 0.95  CALCIUM 8.9 8.9   Liver Function Tests: Recent Labs  Lab 11/27/19 0004 11/28/19 0603  AST 31 21  ALT 48* 40  ALKPHOS 43 43  BILITOT 1.3* 1.0  PROT 8.0 8.2*  ALBUMIN 4.2 4.0   No results for  input(s): LIPASE, AMYLASE in the last 168 hours. No results for input(s): AMMONIA in the last 168 hours. CBC: Recent Labs  Lab 11/27/19 0004 11/28/19 0603  WBC 4.3 6.6  NEUTROABS 2.6 4.9  HGB 14.5 14.3  HCT 41.5 42.3  MCV 89.4 92.6  PLT 116* 129*   Cardiac Enzymes: No results for input(s): CKTOTAL, CKMB, CKMBINDEX, TROPONINI in the last 168 hours. BNP: Invalid input(s): POCBNP CBG: No results for input(s): GLUCAP in the last 168 hours. D-Dimer No results for input(s): DDIMER in the last 72 hours. Hgb A1c No results for input(s): HGBA1C in the last 72 hours. Lipid Profile Recent Labs    11/27/19 0004  TRIG 61   Thyroid function studies No results for input(s): TSH, T4TOTAL, T3FREE, THYROIDAB in the last 72 hours.  Invalid input(s): FREET3 Anemia work up [employer]    11/27/19 0004 11/28/19 0603  FERRITIN 161 161   Urinalysis No results found for: COLORURINE, APPEARANCEUR, LABSPEC, PHURINE, GLUCOSEU, HGBUR, BILIRUBINUR, KETONESUR, PROTEINUR, UROBILINOGEN, NITRITE, LEUKOCYTESUR Sepsis Labs Invalid input(s): PROCALCITONIN,  WBC,  LACTICIDVEN Microbiology Recent Results (from the past 240 hour(s))  Blood Culture (routine x 2)     Status: None (Preliminary result)   Collection Time: 11/27/19  12:04 AM   Specimen: BLOOD  Result Value Ref Brian Mcknight Status   Specimen Description BLOOD BLOOD RIGHT HAND  Final   Special Requests   Final    BOTTLES DRAWN AEROBIC AND ANAEROBIC Blood Culture adequate volume   Culture   Final    NO GROWTH 1 DAY Performed at [facility], [address], [place], [place] [address]    Report Status PENDING  Incomplete  Blood Culture (routine x 2)     Status: None (Preliminary result)   Collection Time: 11/27/19 12:04 AM   Specimen: Left Antecubital; Blood  Result Value Ref Brian Mcknight Status   Specimen Description LEFT ANTECUBITAL  Final   Special Requests   Final    BOTTLES DRAWN AEROBIC AND ANAEROBIC Blood Culture results may not be optimal due to an excessive volume of blood received in culture bottles   Culture   Final    NO GROWTH 1 DAY Performed at [facility], [address] [place], [place] [address]    Report Status PENDING  Incomplete     Time coordinating discharge: Over 30 minutes  SIGNED:   Charise Killian, MD  [facility] 11/28/2019, 2:24 PM Pager   If 7PM-7AM, please contact night-coverage [email or web address]

## 2019-12-02 LAB — CULTURE, BLOOD (ROUTINE X 2)
Culture: NO GROWTH
Culture: NO GROWTH
Special Requests: ADEQUATE

## 2020-07-06 ENCOUNTER — Ambulatory Visit (INDEPENDENT_AMBULATORY_CARE_PROVIDER_SITE_OTHER): Payer: Medicaid Other | Admitting: Internal Medicine

## 2020-07-06 VITALS — BP 137/92 | HR 86 | Temp 98.1°F | Resp 20 | Ht 71.0 in | Wt 395.0 lb

## 2020-07-06 DIAGNOSIS — I10 Essential (primary) hypertension: Secondary | ICD-10-CM

## 2020-07-06 DIAGNOSIS — G4719 Other hypersomnia: Secondary | ICD-10-CM | POA: Insufficient documentation

## 2020-07-06 DIAGNOSIS — Z6841 Body Mass Index (BMI) 40.0 and over, adult: Secondary | ICD-10-CM

## 2020-07-06 NOTE — Progress Notes (Signed)
Sleep Medicine   Office Visit  Patient Name: Brian Mcknight DOB: 07-01-2001 MRN [id number]    Chief Complaint: sleep apnea  Brief History:  Brian Mcknight is seen today because of his concerns that he may have sleep apnea. Recently he has been experiencing sleep paralysis at sleep apnea and upon awakening. He has been snoring for the past few years coincident with a 100 lb weight gain. He wakes himself gasping and choking and wakes to urinate at least twice. He has morning headaches.  The patient goes to sleep at 10:30-1p.m.  and wakes up at 7:30 a.m. feeling tired. He wakes frequently at night.  Patient has noted no restlessness of his legs at night.  The patient  relates no unusual behavior during the night.  He feels tired all day. He denies denies taking naps. The Epworth Sleepiness Score is 15 out of 24 .   Cardiovascular risk factors include: hypertension. The patient reports no cognitive impairment.    ROS  General: (-) fever, (-) chills, (-) night sweat Nose and Sinuses: (-) nasal stuffiness or itchiness, (-) postnasal drip, (-) nosebleeds, (-) sinus trouble. Mouth and Throat: (-) sore throat, (-) hoarseness. Neck: (-) swollen glands, (-) enlarged thyroid, (-) neck pain. Respiratory: -cough, - shortness of breath, - wheezing. Neurologic: - numbness, - tingling. Psychiatric: - anxiety, - depression Sleep behavior:  +sleep paralysis -hypnogogic hallucinations, -dream enactment      +vivid dreams -cataplexy -night terrors + sleep walking   Current Medication: Outpatient Encounter Medications as of 07/06/2020  Medication Sig  . albuterol (VENTOLIN HFA) 108 (90 Base) MCG/ACT inhaler Inhale 2 puffs into the lungs every 6 (six) hours as needed for wheezing or shortness of breath.  Marland Kitchen amLODipine (NORVASC) 5 MG tablet Take 5 mg by mouth daily.  Marland Kitchen ibuprofen (ADVIL) 800 MG tablet Take 800 mg by mouth every 6 (six) hours as needed for pain.  . Vitamin D, Ergocalciferol, (DRISDOL) 1.25 MG (50000  UNIT) CAPS capsule Take 50,000 Units by mouth once a week.   No facility-administered encounter medications on file as of 07/06/2020.    Surgical History: Past Surgical History:  Procedure Laterality Date  . FACIAL COSMETIC SURGERY  17-Jan-1999   pt st he had a bump on his nose when he was born that was removed    Medical History: Past Medical History:  Diagnosis Date  . Hypertension     Family History: Non contributory to the present illness  Social History: Social History   Socioeconomic History  . Marital status: Single    Spouse name: Not on file  . Number of children: Not on file  . Years of education: Not on file  . Highest education level: Not on file  Occupational History  . Not on file  Tobacco Use  . Smoking status: Never Smoker  . Smokeless tobacco: Never Used  Substance and Sexual Activity  . Alcohol use: Never  . Drug use: Never  . Sexual activity: Not on file  Other Topics Concern  . Not on file  Social History Narrative   ** Merged History Encounter **       Social Determinants of Health   Financial Resource Strain: Not on file  Food Insecurity: Not on file  Transportation Needs: Not on file  Physical Activity: Not on file  Stress: Not on file  Social Connections: Not on file  Intimate Partner Violence: Not on file    Vital Signs: Blood pressure (!) 137/92, pulse 86, temperature 98.1 F (36.7  C), temperature source Temporal, resp. rate 20, height 5\' 11"  (1.803 m), weight (!) 395 lb (179.2 kg), SpO2 96 %.  Examination: General Appearance: The patient is well-developed, well-nourished, and in no distress. Neck Circumference: 56 Skin: Gross inspection of skin unremarkable. Head: normocephalic, no gross deformities. Eyes: no gross deformities noted. ENT: ears appear grossly normal Neurologic: Alert and oriented. No involuntary movements.    EPWORTH SLEEPINESS SCALE:  Scale:  (0)= no chance of dozing; (1)= slight chance of dozing;  (2)= moderate chance of dozing; (3)= high chance of dozing  Chance  Situtation    Sitting and reading: 2    Watching TV: 2    Sitting Inactive in public: 2    As a passenger in car: 3      Lying down to rest: 3    Sitting and talking: 0    Sitting quielty after lunch: 3    In a car, stopped in traffic: 0   TOTAL SCORE:   15 out of 24    SLEEP STUDIES:  1. No sleep studies on file   LABS: No results found for this or any previous visit (from the past 2160 hour(s)).  Radiology: [facility] Chest Port 1 View  Result Date: 11/26/2019 CLINICAL DATA:  19 year old male with cough and shortness of breath. EXAM: PORTABLE CHEST 1 VIEW COMPARISON:  None. FINDINGS: No focal consolidation, pleural effusion, or pneumothorax. There is shallow inspiration. Borderline cardiomegaly. No acute osseous pathology. IMPRESSION: No acute cardiopulmonary process. Electronically Signed   By: 26 M.D.   On: 11/26/2019 23:38    No results found.  No results found.    Assessment and Plan: Patient Active Problem List   Diagnosis Date Noted  . Essential hypertension 07/06/2020  . 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 Acute respiratory failure due to COVID-19 (HCC) 11/27/2019  . Morbid obesity with BMI of 50.0-59.9, adult (HCC) 11/27/2019  . Acute respiratory failure with hypoxia (HCC) 11/27/2019  . Elevated blood pressure 03/13/2014     PLAN OSA:   Patient evaluation suggests high risk of sleep disordered breathing due to loud snoring, gasping, poor quality and poorly refreshing sleep and daytime sleepiness. His neck is very large Patient has comorbid cardiovascular risk factors including: hypertension. which could be exacerbated by pathologic sleep-disordered breathing.     Other hypersomnia - PSG will be ordered    OBESITY: Discussed the importance of weight management through healthy eating and daily exercise as tolerated. Discussed the negative effects obesity has on pulmonary  health, cardiac health as well as overall general health and well being. HTN - elevated BP in clinic today. Followed and medication managed by PCP. Advised to followup with PCP re: elevated BP.  General Counseling: I have discussed the findings of the evaluation and examination with Brian Mcknight.  I have also discussed any further diagnostic evaluation thatmay be needed or ordered today. Brian Mcknight verbalizes understanding of the findings of todays visit. We also reviewed his medications today and discussed drug interactions and side effects including but not limited excessive drowsiness and altered mental states. We also discussed that there is always a risk not just to him but also people around him. he has been encouraged to call the office with any questions or concerns that should arise related to todays visit.  No orders of the defined types were placed in this encounter.    This patient was seen by 03/15/2014, AGNP-C in collaboration with Dr. Layla Barter as a part of collaborative care  agreement.    I have personally obtained a history, evaluated the patient, evaluated pertinent data, formulated the assessment and plan and placed orders.   Valentino Hue Sol Blazing, PhD, FAASM  Diplomate, American Board of Sleep Medicine    Yevonne Pax, MD [facility] Diplomate ABMS Pulmonary and Critical Care Medicine Sleep medicine

## 2020-07-06 NOTE — Patient Instructions (Signed)
Sleep Studies A sleep study (polysomnogram) is a series of tests done while you are sleeping. A sleep study records your brain waves, heart rate, breathing rate, oxygen level, and eye and leg movements. A sleep study helps your health care provider:  See how well you sleep.  Diagnose a sleep disorder.  Determine how severe your sleep disorder is.  Create a plan to treat your sleep disorder. Your health care provider may recommend a sleep study if you:  Feel sleepy on most days.  Snore loudly while sleeping.  Have unusual behaviors while you sleep, such as walking.  Have brief periods in which you stop breathing during sleep (sleepapnea).  Fall asleep suddenly during the day (narcolepsy).  Have trouble falling asleep or staying asleep (insomnia).  Feel like you need to move your legs when trying to fall asleep (restless legs syndrome).  Move your legs by flexing and extending them regularly while asleep (periodic limb movement disorder).  Act out your dreams while you sleep (sleep behavior disorder).  Feel like you cannot move when you first wake up (sleep paralysis). What tests are part of a sleep study? Most sleep studies record the following during sleep:  Brain activity.  Eye movements.  Heart rate and rhythm.  Breathing rate and rhythm.  Blood-oxygen level.  Blood pressure.  Chest and belly movement as you breathe.  Arm and leg movements.  Snoring or other noises.  Body position. Where are sleep studies done? Sleep studies are done at sleep centers. A sleep center may be inside a hospital, office, or clinic. The room where you have the study may look like a hospital room or a hotel room. The health care providers doing the study may come in and out of the room during the study. Most of the time, they will be in another room monitoring your test as you sleep. How are sleep studies done? Most sleep studies are done during a normal period of time for a full  night of sleep. You will arrive at the study center in the evening and go home in the morning. Before the test  Bring your pajamas and toothbrush with you to the sleep study.  Do not have caffeine on the day of your sleep study.  Do not drink alcohol on the day of your sleep study.  Your health care provider will let you know if you should stop taking any of your regular medicines before the test. During the test      Round, sticky patches with sensors attached to recording wires (electrodes) are placed on your scalp, face, chest, and limbs.  Wires from all the electrodes and sensors run from your bed to a computer. The wires can be taken off and put back on if you need to get out of bed to go to the bathroom.  A sensor is placed over your nose to measure airflow.  A finger clip is put on your finger or ear to measure your blood oxygen level (pulse oximetry).  A belt is placed around your belly and a belt is placed around your chest to measure breathing movements.  If you have signs of the sleep disorder called sleep apnea during your test, you may get a treatment mask to wear for the second half of the night. ? The mask provides positive airway pressure (PAP) to help you breathe better during sleep. This may greatly improve your sleep apnea. ? You will then have all tests done again with the mask   in place to see if your measurements and recordings change. After the test  A medical doctor who specializes in sleep will evaluate the results of your sleep study and share them with you and your primary health care provider.  Based on your results, your medical history, and a physical exam, you may be diagnosed with a sleep disorder, such as: ? Sleep apnea. ? Restless legs syndrome. ? Sleep-related behavior disorder. ? Sleep-related movement disorders. ? Sleep-related seizure disorders.  Your health care team will help determine your treatment options based on your diagnosis. This  may include: ? Improving your sleep habits (sleep hygiene). ? Wearing a continuous positive airway pressure (CPAP) or bi-level positive airway pressure (BPAP) mask. ? Wearing an oral device at night to improve breathing and reduce snoring. ? Taking medicines. Follow these instructions at home:  Take over-the-counter and prescription medicines only as told by your health care provider.  If you are instructed to use a CPAP or BPAP mask, make sure you use it nightly as directed.  Make any lifestyle changes that your health care provider recommends.  If you were given a device to open your airway while you sleep, use it only as told by your health care provider.  Do not use any tobacco products, such as cigarettes, chewing tobacco, and e-cigarettes. If you need help quitting, ask your health care provider.  Keep all follow-up visits as told by your health care provider. This is important. Summary  A sleep study (polysomnogram) is a series of tests done while you are sleeping. It shows how well you sleep.  Most sleep studies are done over one full night of sleep. You will arrive at the study center in the evening and go home in the morning.  If you have signs of the sleep disorder called sleep apnea during your test, you may get a treatment mask to wear for the second half of the night.  A medical doctor who specializes in sleep will evaluate the results of your sleep study and share them with your primary health care provider. This information is not intended to replace advice given to you by your health care provider. Make sure you discuss any questions you have with your health care provider. Document Revised: 12/26/2018 Document Reviewed: 08/08/2017 Elsevier Patient Education  2020 [employer]  

## 2020-07-23 ENCOUNTER — Ambulatory Visit
Admission: EM | Admit: 2020-07-23 | Discharge: 2020-07-23 | Disposition: A | Discharge status: Home / Self Care | Payer: Medicaid Other | Attending: Physician Assistant | Admitting: Physician Assistant

## 2020-07-23 ENCOUNTER — Other Ambulatory Visit: Payer: Self-pay

## 2020-07-23 ENCOUNTER — Encounter: Payer: Self-pay | Admitting: Emergency Medicine

## 2020-07-23 ENCOUNTER — Ambulatory Visit (INDEPENDENT_AMBULATORY_CARE_PROVIDER_SITE_OTHER): Payer: Medicaid Other

## 2020-07-23 DIAGNOSIS — Z20822 Contact with and (suspected) exposure to covid-19: Secondary | ICD-10-CM | POA: Diagnosis not present

## 2020-07-23 DIAGNOSIS — B349 Viral infection, unspecified: Secondary | ICD-10-CM | POA: Diagnosis not present

## 2020-07-23 DIAGNOSIS — R059 Cough, unspecified: Secondary | ICD-10-CM | POA: Insufficient documentation

## 2020-07-23 DIAGNOSIS — R519 Headache, unspecified: Secondary | ICD-10-CM | POA: Diagnosis not present

## 2020-07-23 DIAGNOSIS — Z6841 Body Mass Index (BMI) 40.0 and over, adult: Secondary | ICD-10-CM | POA: Insufficient documentation

## 2020-07-23 DIAGNOSIS — R0981 Nasal congestion: Secondary | ICD-10-CM

## 2020-07-23 HISTORY — DX: Obesity, unspecified: E66.9

## 2020-07-23 LAB — RESP PANEL BY RT-PCR (FLU A&B, COVID) ARPGX2
Influenza A by PCR: NEGATIVE
Influenza B by PCR: NEGATIVE
SARS Coronavirus 2 by RT PCR: NEGATIVE

## 2020-07-23 MED ORDER — PSEUDOEPH-BROMPHEN-DM 30-2-10 MG/5ML PO SYRP
10.0000 mL | ORAL_SOLUTION | Freq: Four times a day (QID) | ORAL | 0 refills | Status: AC | PRN
Start: 2020-07-23 — End: 2020-07-30

## 2020-07-23 NOTE — ED Provider Notes (Signed)
[facility]    CSN: [id number] Arrival date & time: 07/23/20  1451      History   Chief Complaint Chief Complaint  Patient presents with   Cough   Nasal Congestion   Headache    HPI Brian Mcknight is a 19 y.o. male presenting for 3-day history of headaches, cough, and nasal congestion.  Patient denies any fever.  Patient denies any known Covid exposure.  Patient has not been vaccinated for COVID-19.  Patient has a personal history of COVID-19 in April 2021.  Past medical history significant for hypertension and obesity.  Patient says his family thinks he might have pneumonia.  He denies any associated fever or shortness of breath.  He says that sometimes when he coughs his chest hurts.  Denies any weakness.  No sore throats.  No history of asthma.  He has been taking over-the-counter Mucinex.  No other complaints or concerns.  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 Authorizing Provider  albuterol (VENTOLIN HFA) 108 (90 Base) MCG/ACT inhaler Inhale 2 puffs into the lungs every 6 (six) hours as needed for wheezing or shortness of breath. 11/28/19 12/28/19 Yes Charise Killian, MD  amLODipine (NORVASC) 5 MG tablet Take 5 mg by mouth daily. 09/03/19  Yes [provider]  brompheniramine-pseudoephedrine-DM 30-2-10  MG/5ML syrup Take 10 mLs by mouth 4 (four) times daily as needed for up to 7 days. 07/23/20 07/30/20 Yes Eusebio Friendly B, PA-C  ibuprofen (ADVIL) 800 MG tablet Take 800 mg by mouth every 6 (six) hours as needed for pain. 11/14/19  Yes [provider]  Vitamin D, Ergocalciferol, (DRISDOL) 1.25 MG (50000 UNIT) CAPS capsule Take 50,000 Units by mouth once a week. 09/13/19  Yes [provider]    Family History Family History  Problem Relation Age of Onset   Healthy Mother    Drug abuse Father     Social History Social History   Tobacco Use   Smoking status: Never Smoker   Smokeless tobacco: Never Used  Building services engineer Use: Never used  Substance Use Topics   Alcohol use: Yes    Comment: 3 times per month   Drug use: Never     Allergies   Patient has no known allergies.   Review of Systems Review of Systems  Constitutional: Positive for fatigue. Negative for fever.  HENT: Positive for congestion. Negative for rhinorrhea, sinus pressure, sinus pain and sore throat.   Respiratory: Positive for cough. Negative for shortness of breath.   Gastrointestinal: Negative for abdominal pain, diarrhea, nausea and vomiting.  Musculoskeletal: Negative for myalgias.  Neurological: Positive for headaches. Negative for weakness and light-headedness.  Hematological: Negative for adenopathy.     Physical Exam Triage Vital Signs ED Triage  Vitals  Enc Vitals Group     BP 07/23/20 1702 (!) 138/91     Pulse Rate 07/23/20 1702 91     Resp 07/23/20 1702 18     Temp 07/23/20 1702 99.3 F (37.4 C)     Temp Source 07/23/20 1702 Oral     SpO2 07/23/20 1702 97 %     Weight 07/23/20 1701 (!) 380 lb (172.4 kg)     Height 07/23/20 1701 6' (1.829 m)     Head Circumference --      Peak Flow --      Pain Score 07/23/20 1701 7     Pain Loc --      Pain Edu? --      Excl. in GC? --    No data found.  Updated Vital Signs BP (!) 138/91 (BP Location: Left Arm)    Pulse 91     Temp 99.3 F (37.4 C) (Oral)    Resp 18    Ht 6' (1.829 m)    Wt (!) 380 lb (172.4 kg)    SpO2 97%    BMI 51.54 kg/m       Physical Exam Vitals and nursing note reviewed.  Constitutional:      General: He is not in acute distress.    Appearance: Normal appearance. He is well-developed and well-nourished. He is obese. He is not ill-appearing or diaphoretic.  HENT:     Head: Normocephalic and atraumatic.     Nose: Congestion and rhinorrhea present.     Mouth/Throat:     Mouth: Oropharynx is clear and moist and mucous membranes are normal.     Pharynx: Uvula midline. No oropharyngeal exudate.     Tonsils: No tonsillar abscesses.  Eyes:     General: No scleral icterus.       Right eye: No discharge.        Left eye: No discharge.     Extraocular Movements: EOM normal.     Conjunctiva/sclera: Conjunctivae normal.  Neck:     Thyroid: No thyromegaly.     Trachea: No tracheal deviation.  Cardiovascular:     Rate and Rhythm: Normal rate and regular rhythm.     Heart sounds: Normal heart sounds.  Pulmonary:     Effort: Pulmonary effort is normal. No respiratory distress.     Breath sounds: Normal breath sounds. No wheezing, rhonchi or rales.  Musculoskeletal:     Cervical back: Normal range of motion and neck supple.  Skin:    General: Skin is warm and dry.     Findings: No rash.  Neurological:     General: No focal deficit present.     Mental Status: He is alert. Mental status is at baseline.     Motor: No weakness.     Gait: Gait normal.  Psychiatric:        Mood and Affect: Mood normal.        Behavior: Behavior normal.        Thought Content: Thought content normal.      UC Treatments / Results  Labs (all labs ordered are listed, but only abnormal results are displayed) Labs Reviewed  RESP PANEL BY RT-PCR (FLU A&B, COVID) [id number]    EKG   Radiology DG Chest 2 View  Result Date: 07/23/2020 CLINICAL DATA:  Cough, congestion and headache. EXAM: CHEST - 2 VIEW  COMPARISON:  11/26/2019 FINDINGS: Initial low lung volumes improved on repeat imaging. Upper normal heart size. No focal  airspace disease. No pulmonary edema. No pleural fluid or pneumothorax. No acute osseous abnormalities are seen. Detailed assessment limited by soft tissue attenuation from habitus. IMPRESSION: Low lung volumes without acute abnormality. Electronically Signed   By: Narda Rutherford M.D.   On: 07/23/2020 18:18    Procedures Procedures (including critical care time)  Medications Ordered in UC Medications - No data to display  Initial Impression / Assessment and Plan / UC Course  I have reviewed the triage vital signs and the nursing notes.  Pertinent labs & imaging results that were available during my care of the patient were reviewed by me and considered in my medical decision making (see chart for details).   19 year old male presenting for 3-day history of cough and congestion.  All vital signs are normal.  Chest is clear to auscultation.  Respiratory panel negative.  Patient concerned that he might have pneumonia.  Advised him that his vitals do not reflect that his chest sounds clear to auscultation.  Says his family is concerned so I obtained a chest x-ray which is within normal limits.  Advised patient he has an upper respiratory infection.  Advised supportive care with increasing rest and fluids.  Sent Bromfed to pharmacy.  Advised to follow-up as needed for any new or worsening symptoms.  ED precautions reviewed.   Final Clinical Impressions(s) / UC Diagnoses   Final diagnoses:  Viral illness  Cough  Nasal congestion     Discharge Instructions     Negative Covid and flu test.  Chest x-ray is normal.  URI/COLD SYMPTOMS: Your exam today is consistent with a viral illness. Antibiotics are not indicated at this time. Use medications as directed, including cough syrup, nasal saline, and decongestants. Your symptoms should improve over the next few days and  resolve within 7-10 days. Increase rest and fluids. F/u if symptoms worsen or predominate such as sore throat, ear pain, productive cough, shortness of breath, or if you develop high fevers or worsening fatigue over the next several days.      ED Prescriptions    Medication Sig Dispense Auth. Provider   brompheniramine-pseudoephedrine-DM 30-2-10 MG/5ML syrup Take 10 mLs by mouth 4 (four) times daily as needed for up to 7 days. 150 mL Shirlee Latch, PA-C     PDMP not reviewed this encounter.   Shirlee Latch, PA-C 07/23/20 1835

## 2020-07-23 NOTE — ED Triage Notes (Addendum)
Patient in today c/o cough, nasal congestion and headache x 3 days. Patient denies fever. Patient has taken OTC Robitussin. Patient has not had the covid vaccines. Patient had covid April 2021.

## 2020-07-23 NOTE — Discharge Instructions (Addendum)
Negative Covid and flu test.  Chest x-ray is normal.  URI/COLD SYMPTOMS: Your exam today is consistent with a viral illness. Antibiotics are not indicated at this time. Use medications as directed, including cough syrup, nasal saline, and decongestants. Your symptoms should improve over the next few days and resolve within 7-10 days. Increase rest and fluids. F/u if symptoms worsen or predominate such as sore throat, ear pain, productive cough, shortness of breath, or if you develop high fevers or worsening fatigue over the next several days.

## 2020-11-16 ENCOUNTER — Ambulatory Visit: Payer: Medicaid Other | Admitting: Internal Medicine

## 2020-11-16 NOTE — Progress Notes (Signed)
Pt canceled his appointment. Office staff will reach out to reschedule.  

## 2020-12-07 ENCOUNTER — Ambulatory Visit (INDEPENDENT_AMBULATORY_CARE_PROVIDER_SITE_OTHER): Payer: Medicaid Other | Admitting: Internal Medicine

## 2020-12-07 VITALS — BP 141/87 | HR 78 | Resp 16 | Ht 70.0 in | Wt 390.0 lb

## 2020-12-07 DIAGNOSIS — Z7189 Other specified counseling: Secondary | ICD-10-CM | POA: Diagnosis not present

## 2020-12-07 DIAGNOSIS — G4733 Obstructive sleep apnea (adult) (pediatric): Secondary | ICD-10-CM | POA: Diagnosis not present

## 2020-12-07 DIAGNOSIS — Z9989 Dependence on other enabling machines and devices: Secondary | ICD-10-CM

## 2020-12-07 DIAGNOSIS — Z6841 Body Mass Index (BMI) 40.0 and over, adult: Secondary | ICD-10-CM

## 2020-12-07 NOTE — Patient Instructions (Signed)

## 2020-12-07 NOTE — Progress Notes (Signed)
[facility] [address] Brian [place], [place] [address]  Pulmonary Sleep Medicine   Office Visit Note  Patient Name: Brian Mcknight DOB: 10-09-2000 MRN [id number]    Chief Complaint: Obstructive Sleep Apnea visit  Brief History:  [name] is seen today for followup after initial setup.  The patient has a 3 month history of sleep apnea. Patient not using PAP nightly.  The patient feels much beter after sleeping with PAP.  The patient reports benefitting from PAP use. Reported Epworth Sleepiness Score is 11 out of 24. The patient takes 2 - 3 naps/week of 30 - 1 hour each. The patient complains of the following nasal mask shifting despite cleaning weekly.  Patient said he schedule has changed and he is wearing it more.  The compliance download shows  compliance with an average use time of 2:21 hours. The AHI is 0.9  The patient does not complain of limb movements disrupting sleep.  ROS  General: (-) fever, (-) chills, (-) night sweat Nose and Sinuses: (-) nasal stuffiness or itchiness, (-) postnasal drip, (-) nosebleeds, (-) sinus trouble. Mouth and Throat: (-) sore throat, (-) hoarseness. Neck: (-) swollen glands, (-) enlarged thyroid, (-) neck pain. Respiratory: - cough, - shortness of breath, - wheezing. Neurologic: - numbness, - tingling. Psychiatric: - anxiety, - depression   Current Medication: Outpatient Encounter Medications as of 12/07/2020  Medication Sig Note  . amLODipine (NORVASC) 5 MG tablet Take 1 tablet by mouth daily.   Marland Kitchen albuterol (VENTOLIN HFA) 108 (90 Base) MCG/ACT inhaler Inhale 2 puffs into the lungs every 6 (six) hours as needed for wheezing or shortness of breath. 07/23/2020: Patient is out  . amLODipine (NORVASC) 5 MG tablet Take 5 mg by mouth daily. 07/23/2020: Patient taking 3 times per week  . ibuprofen (ADVIL) 800 MG tablet Take 800 mg by mouth every 6 (six) hours as needed for pain.   . Vitamin D, Ergocalciferol, (DRISDOL) 1.25 MG (50000 UNIT)  CAPS capsule Take 50,000 Units by mouth once a week.    No facility-administered encounter medications on file as of 12/07/2020.    Surgical History: Past Surgical History:  Procedure Laterality Date  . FACIAL COSMETIC SURGERY  06/03/1999   pt st he had a bump on his nose when he was born that was removed    Medical History: Past Medical History:  Diagnosis Date  . Hypertension   . Obesity     Family History: Non contributory to the present illness  Social History: Social History   Socioeconomic History  . Marital status: Single    Spouse name: Not on file  . Number of children: Not on file  . Years of education: Not on file  . Highest education level: Not on file  Occupational History  . Not on file  Tobacco Use  . Smoking status: Never Smoker  . Smokeless tobacco: Never Used  Vaping Use  . Vaping Use: Never used  Substance and Sexual Activity  . Alcohol use: Yes    Comment: 3 times per month  . Drug use: Never  . Sexual activity: Not on file  Other Topics Concern  . Not on file  Social History Narrative   ** Merged History Encounter **       Social Determinants of Health   Financial Resource Strain: Not on file  Food Insecurity: Not on file  Transportation Needs: Not on file  Physical Activity: Not on file  Stress: Not on file  Social Connections: Not on  file  Intimate Partner Violence: Not on file    Vital Signs: Blood pressure (!) 141/87, pulse 78, resp. rate 16, height 5\' 10"  (1.778 m), weight (!) 390 lb (176.9 kg), SpO2 97 %.  Examination: General Appearance: The patient is well-developed, well-nourished, and in no distress. Neck Circumference: 59 Skin: Gross inspection of skin unremarkable. Head: normocephalic, no gross deformities. Eyes: no gross deformities noted. ENT: ears appear grossly normal Neurologic: Alert and oriented. No involuntary movements.    EPWORTH SLEEPINESS SCALE:  Scale:  (0)= no chance of dozing; (1)= slight  chance of dozing; (2)= moderate chance of dozing; (3)= high chance of dozing  Chance  Situtation    Sitting and reading: 0    Watching TV: 2    Sitting Inactive in public: 1    As a passenger in car: 3      Lying down to rest: 3    Sitting and talking: 0    Sitting quielty after lunch: 2    In a car, stopped in traffic: 0   TOTAL SCORE:   11 out of 24    SLEEP STUDIES:  1. HST -  RDI 6.0,  Low SpO2 79%    CPAP COMPLIANCE DATA:  Date Range: 10/06/20 - 12/04/20  Average Daily Use: 2:21 hours  Median Use: 3:16 hours  Compliance for > 4 Hours: 23% days  AHI: 0.9 respiratory events per hour  Days Used: 36/60 days  Mask Leak: 27.1 lpm  95th Percentile Pressure: 10.8 cmH2O         LABS: No results found for this or any previous visit (from the past 2160 hour(s)).  Radiology: DG Chest 2 View  Result Date: 07/23/2020 CLINICAL DATA:  Cough, congestion and headache. EXAM: CHEST - 2 VIEW COMPARISON:  11/26/2019 FINDINGS: Initial low lung volumes improved on repeat imaging. Upper normal heart size. No focal airspace disease. No pulmonary edema. No pleural fluid or pneumothorax. No acute osseous abnormalities are seen. Detailed assessment limited by soft tissue attenuation from habitus. IMPRESSION: Low lung volumes without acute abnormality. Electronically Signed   By: 01/26/2020 M.D.   On: 07/23/2020 18:18    No results found.  No results found.    Assessment and Plan: Patient Active Problem List   Diagnosis Date Noted  . OSA on CPAP 12/07/2020  . CPAP use counseling 12/07/2020  . Essential hypertension 07/06/2020  . 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 Acute respiratory failure due to COVID-19 (HCC) 11/27/2019  . Morbid obesity with BMI of 50.0-59.9, adult (HCC) 11/27/2019  . Acute respiratory failure with hypoxia (HCC) 11/27/2019  . Elevated blood pressure 03/13/2014   1. OSA on CPAP The patient does tolerate PAP and reports  benefit from PAP  use. The patient was reminded how to compliance and cleaning equipment and advised to try a different mask style. . The compliance is poor, but improving in the last week due to a work schedule change. . The AHI is 0.9 . OSA- improve compliance. Reviewed sleep study results with pt and emphasized importance and risks of not treating his sleep apnea adequately. He feels more able to use it consistently with his new work schedule. F/u in 6-8 wk.     2. CPAP use counseling CPAP Counseling: had a lengthy discussion with the patient regarding the importance of PAP therapy in management of the sleep apnea. Patient appears to understand the risk factor reduction and also understands the risks associated with untreated sleep apnea. Patient will try to  make a good faith effort to remain compliant with therapy. Also instructed the patient on proper cleaning of the device including the water must be changed daily if possible and use of distilled water is preferred. Patient understands that the machine should be regularly cleaned with appropriate recommended cleaning solutions that do not damage the PAP machine for example given white vinegar and water rinses. Other methods such as ozone treatment may not be as good as these simple methods to achieve cleaning.  3. Morbid obesity with BMI of 50.0-59.9, adult (HCC) Obesity Counseling: Had a lengthy discussion regarding patients BMI and weight issues. Patient was instructed on portion control as well as increased activity. Also discussed caloric restrictions with trying to maintain intake less than 2000 Kcal. Discussions were made in accordance with the 5As of weight management. Simple actions such as not eating late and if able to, taking a walk is suggested.    General Counseling: I have discussed the findings of the evaluation and examination with Clide.  I have also discussed any further diagnostic evaluation thatmay be needed or ordered today. Luz verbalizes  understanding of the findings of todays visit. We also reviewed his medications today and discussed drug interactions and side effects including but not limited excessive drowsiness and altered mental states. We also discussed that there is always a risk not just to him but also people around him. he has been encouraged to call the office with any questions or concerns that should arise related to todays visit.  No orders of the defined types were placed in this encounter.       I have personally obtained a history, examined the patient, evaluated laboratory and imaging results, formulated the assessment and plan and placed orders.   This patient was seen today by Emmaline Kluver, PA-C in collaboration with Dr. Freda Munro.    Yevonne Pax, MD [facility] Diplomate ABMS Pulmonary and Critical Care Medicine Sleep medicine

## 2021-01-29 NOTE — Progress Notes (Signed)
Pt did not show for scheduled appointment. Office staff will reach out to reschedule.  

## 2021-02-01 ENCOUNTER — Ambulatory Visit: Payer: Self-pay | Admitting: Internal Medicine

## 2021-09-30 ENCOUNTER — Other Ambulatory Visit: Payer: Self-pay

## 2021-09-30 ENCOUNTER — Emergency Department
Admission: EM | Admit: 2021-09-30 | Discharge: 2021-09-30 | Disposition: A | Discharge status: Home / Self Care | Payer: Medicaid Other | Attending: Emergency Medicine | Admitting: Emergency Medicine

## 2021-09-30 DIAGNOSIS — K625 Hemorrhage of anus and rectum: Secondary | ICD-10-CM | POA: Diagnosis present

## 2021-09-30 DIAGNOSIS — I10 Essential (primary) hypertension: Secondary | ICD-10-CM | POA: Insufficient documentation

## 2021-09-30 NOTE — Discharge Instructions (Addendum)
Please reach out to the GI doctors to be seen in the clinic.  ? ?If you develop any associated severe pain, fever, passing out ?

## 2021-09-30 NOTE — ED Triage Notes (Signed)
Pt presents to ER c/o one episode of bright red stool that occurred around 45 minutes ago. Pt denies hemorrhoids in the past.  States he noticed the blood in the stool and when he wiped.  Pt denies any rectal bleeding in past.  Pt not on blood thinners.  Pt A&O x4 at this time and ambulatory to room.   ?

## 2021-09-30 NOTE — ED Provider Notes (Signed)
? ?[facility] ?Provider Note ? ? ? Event Date/Time  ? First MD Initiated Contact with Patient 09/30/21 0131   ?  (approximate) ? ? ?History  ? ?Rectal Bleeding ? ? ?HPI ? ?Brian Mcknight is a 21 y.o. male who presents to the ED for evaluation of Rectal Bleeding ?  ?I reviewed outpatient pulmonology visit from May.  History of morbid obesity with a BMI of 55, associated OSA, hypertension and no blood thinners. ? ?Patient presents to the ED for evaluation of 1 episode of hematochezia that occurred just prior to arrival.  Reports going to pass a bowel movement, but noted blood in the toilet paper and saw blood in the toilet bowl, so he presented to the ED for evaluation.  Denies pain to his rectum, abdomen or any anorectal trauma.  This has never happened before.  He reports feeling fine now. ? ?Denies any other bleeding such as hematuria, melena, epistaxis or bleeding gums while brushing his teeth. ? ?Physical Exam  ? ?Triage Vital Signs: ?ED Triage Vitals [09/30/21 0130]  ?Enc Vitals Group  ?   BP (!) 163/116  ?   Pulse Rate 72  ?   Resp 18  ?   Temp 97.6 ?F (36.4 ?C)  ?   Temp Source Oral  ?   SpO2 99 %  ?   Weight (!) 380 lb (172.4 kg)  ?   Height 5\' 10"  (1.778 m)  ?   Head Circumference   ?   Peak Flow   ?   Pain Score 4  ?   Pain Loc   ?   Pain Edu?   ?   Excl. in GC?   ? ? ?Most recent vital signs: ?Vitals:  ? 09/30/21 0130 09/30/21 0231  ?BP: (!) 163/116 (!) 166/109  ?Pulse: 72 74  ?Resp: 18 18  ?Temp: 97.6 ?F (36.4 ?C) (!) 97.5 ?F (36.4 ?C)  ?SpO2: 99% 100%  ? ? ?General: Awake, no distress.  Morbidly obese.  Pleasant and conversational. ?CV:  Good peripheral perfusion.  ?Resp:  Normal effort.  ?Abd:  No distention.  Soft and benign ?Rectal:  External exam with small amount of dried red blood around the anus.  No fissures identified and no external hemorrhoids. ?MSK:  No deformity noted.  ?Neuro:  No focal deficits appreciated. ?Other:   ? ? ?ED Results / Procedures / Treatments   ? ?Labs ?(all labs ordered are listed, but only abnormal results are displayed) ?Labs Reviewed - No data to display ? ?EKG ? ? ?RADIOLOGY ? ? ?Official radiology report(s): ?No results found. ? ?PROCEDURES and INTERVENTIONS: ? ?Procedures ? ?Medications - No data to display ? ? ?IMPRESSION / MDM / ASSESSMENT AND PLAN / ED COURSE  ?I reviewed the triage vital signs and the nursing notes. ? ?21 year old male presents to the ED after a single episode of rectal bleeding, suitable for outpatient management with follow-up with GI.  He looks clinically well and has a benign abdomen.  No evidence of decompensated anemia or indications for serum diagnostics.  Discussed possible etiologies, to include diverticulitis, internal hemorrhoids or other colitis.  We discussed management at home, following up with GI and appropriate return precautions for the ED.  Suitable for outpatient management. ? ?  ? ? ?FINAL CLINICAL IMPRESSION(S) / ED DIAGNOSES  ? ?Final diagnoses:  ?Rectal bleeding  ? ? ? ?Rx / DC Orders  ? ?ED Discharge Orders   ? ? None  ? ?  ? ? ? ?  Note:  This document was prepared using Dragon voice recognition software and may include unintentional dictation errors. ?  ?Delton Prairie, MD ?09/30/21 [phone] ? ?

## 2021-12-31 ENCOUNTER — Ambulatory Visit
Admission: EM | Admit: 2021-12-31 | Discharge: 2021-12-31 | Disposition: A | Discharge status: Home / Self Care | Payer: Medicaid Other | Attending: Internal Medicine | Admitting: Internal Medicine

## 2021-12-31 DIAGNOSIS — J029 Acute pharyngitis, unspecified: Secondary | ICD-10-CM

## 2021-12-31 DIAGNOSIS — I10 Essential (primary) hypertension: Secondary | ICD-10-CM | POA: Diagnosis present

## 2021-12-31 LAB — GROUP A STREP BY PCR: Group A Strep by PCR: NOT DETECTED

## 2021-12-31 NOTE — ED Triage Notes (Addendum)
Pt c/o possible strep throat.  Pt states that his throat is sore and it hurts to swallow x1day.   Pt asks to be tested for strep.

## 2021-12-31 NOTE — Discharge Instructions (Addendum)
Warm salt water gargle Chloraseptic throat spray Tylenol/Motrin as needed for pain and/or fever Strep test is negative Return to urgent care if symptoms worsen.

## 2022-01-04 NOTE — ED Provider Notes (Signed)
[facility]    CSN: [id number] Arrival date & time: 12/31/21  1625      History   Chief Complaint Chief Complaint  Patient presents with   Sore Throat    HPI Brian Mcknight is a 21 y.o. male comes to the urgent care with 1 day history of sore throat and pain on swallowing.  Patient's symptoms started today and has been persistent.  He denies any fever or chills.  No nausea or vomiting.  No exposure to sick contacts.  Throat pain is throbbing, constant with no cough or sputum production.  No shortness of breath or wheezing  Patient's blood pressure is elevated.  He has a history of hypertension currently on amlodipine but takes his blood pressure medications biweekly.  He denies any chest pain or chest pressure.  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 Authorizing Provider  amLODipine (NORVASC) 5 MG tablet Take 5 mg by mouth daily. 09/03/19  Yes [provider]  ibuprofen (ADVIL) 800 MG tablet Take 800 mg by mouth every 6 (six) hours as needed for pain. 11/14/19  Yes [provider]  Vitamin D, Ergocalciferol, (DRISDOL) 1.25 MG (50000 UNIT) CAPS capsule Take 50,000 Units by mouth once a week. 09/13/19  Yes [provider]  albuterol  (VENTOLIN HFA) 108 (90 Base) MCG/ACT inhaler Inhale 2 puffs into the lungs every 6 (six) hours as needed for wheezing or shortness of breath. 11/28/19 12/28/19  Charise Killian, MD  amLODipine (NORVASC) 5 MG tablet Take 1 tablet by mouth daily. 11/17/20 12/17/20  [provider]    Family History Family History  Problem Relation Age of Onset   Healthy Mother    Drug abuse Father     Social History Social History   Tobacco Use   Smoking status: Never   Smokeless tobacco: Never  Vaping Use   Vaping Use: Never used  Substance Use Topics   Alcohol use: Yes    Comment: 3 times per month   Drug use: Never     Allergies   Coconut fatty acids   Review of Systems Review of Systems As per HPI  Physical Exam Triage Vital Signs ED Triage Vitals  Enc Vitals Group     BP 12/31/21 1641 (!) 173/115     Pulse Rate 12/31/21 1641 72     Resp 12/31/21 1641 18     Temp 12/31/21 1641 99.2 F (37.3 C)     Temp Source 12/31/21 1641 Oral     SpO2 12/31/21 1641 97 %     Weight 12/31/21 1638 (!) 390 lb (176.9 kg)     Height 12/31/21 1638 5\' 10"  (1.778 m)  Head Circumference --      Peak Flow --      Pain Score 12/31/21 1638 7     Pain Loc --      Pain Edu? --      Excl. in GC? --    No data found.  Updated Vital Signs BP (!) 173/115 (BP Location: Left Arm)   Pulse 72   Temp 99.2 F (37.3 C) (Oral)   Resp 18   Ht 5\' 10"  (1.778 m)   Wt (!) 176.9 kg   SpO2 97%   BMI 55.96 kg/m   Visual Acuity Right Eye Distance:   Left Eye Distance:   Bilateral Distance:    Right Eye Near:   Left Eye Near:    Bilateral Near:     Physical Exam Vitals and nursing note reviewed.  Constitutional:      General: He is not in acute distress.    Appearance: He is not ill-appearing.  HENT:     Right Ear: Tympanic membrane normal.     Left Ear: Tympanic membrane normal.     Mouth/Throat:     Mouth: Mucous membranes are moist. Mucous membranes are pale.     Pharynx: Posterior  oropharyngeal erythema present.     Tonsils: No tonsillar abscesses.  Cardiovascular:     Rate and Rhythm: Normal rate and regular rhythm.     Heart sounds: Normal heart sounds.  Neurological:     Mental Status: He is alert.      UC Treatments / Results  Labs (all labs ordered are listed, but only abnormal results are displayed) Labs Reviewed  GROUP A STREP BY PCR    EKG   Radiology No results found.  Procedures Procedures (including critical care time)  Medications Ordered in UC Medications - No data to display  Initial Impression / Assessment and Plan / UC Course  I have reviewed the triage vital signs and the nursing notes.  Pertinent labs & imaging results that were available during my care of the patient were reviewed by me and considered in my medical decision making (see chart for details).     1.  Acute pharyngitis likely viral: Strep PCR test is negative Warm salt water gargle Tylenol/Motrin as needed for pain Chloraseptic throat spray Maintain adequate hydration Return to urgent care if symptoms worsen.  2.  Hypertension urgency: Patient is encouraged to take his amlodipine daily Weight loss, increase exercise and reduce salt intake will help with blood pressure control Return precautions as above Final Clinical Impressions(s) / UC Diagnoses   Final diagnoses:  Acute pharyngitis, unspecified etiology  Uncontrolled hypertension     Discharge Instructions      Warm salt water gargle Chloraseptic throat spray Tylenol/Motrin as needed for pain and/or fever Strep test is negative Return to urgent care if symptoms worsen.   ED Prescriptions   None    PDMP not reviewed this encounter.   , MD 01/04/22 [phone]

## 2022-07-22 IMAGING — CR DG CHEST 2V
4 series · 4 of 4 positions shown · non-contrast
Comparison: 11/26/2019

CLINICAL DATA: Cough, congestion and headache.

EXAM:
CHEST - 2 VIEW

[chest pa (1 of 2)]
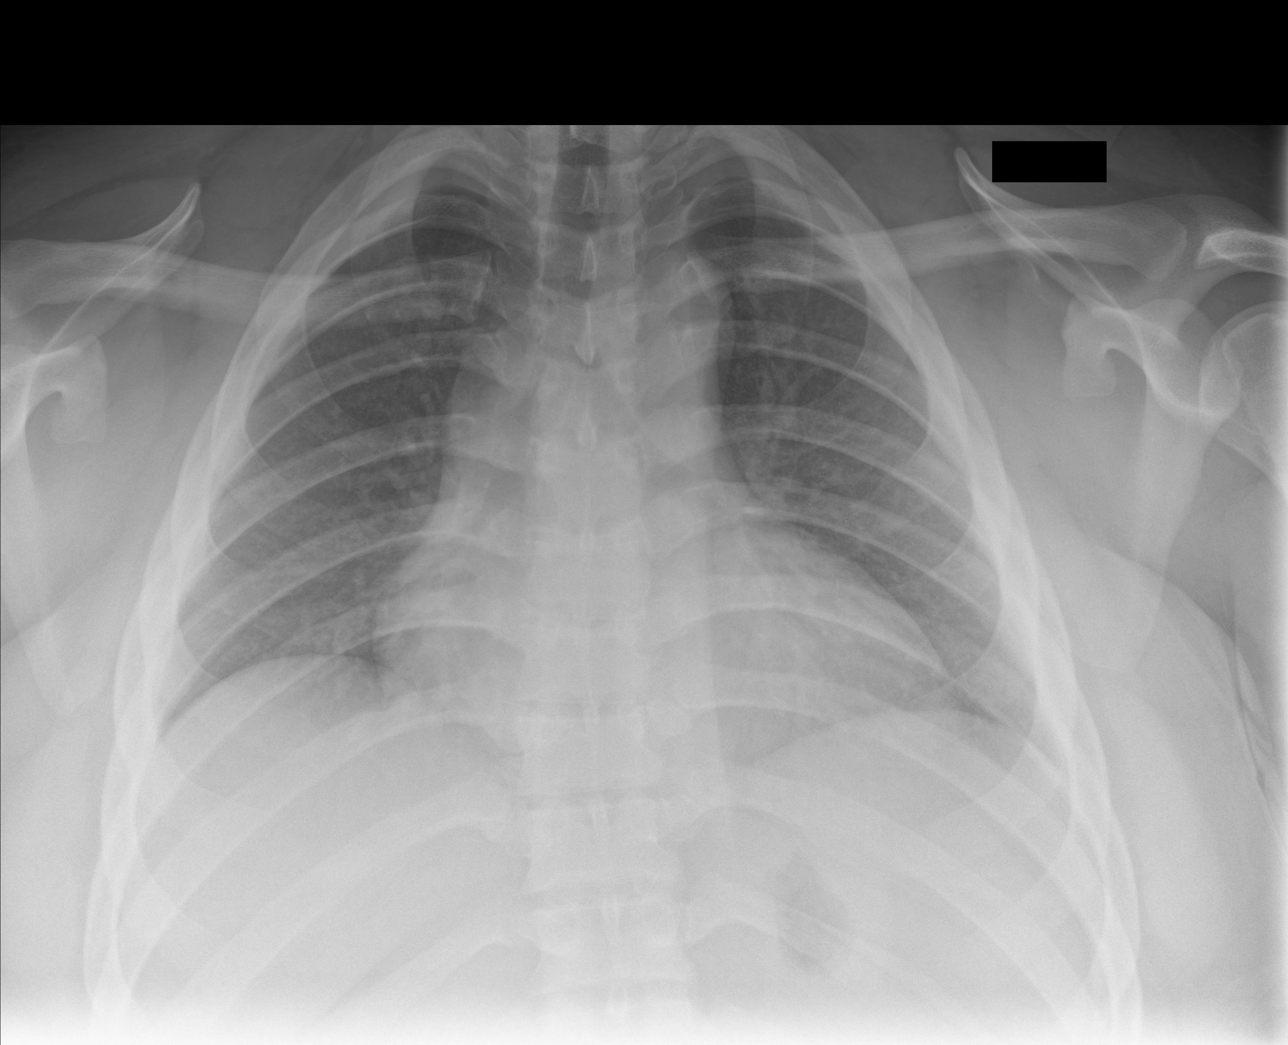

[chest lat (1 of 2)]
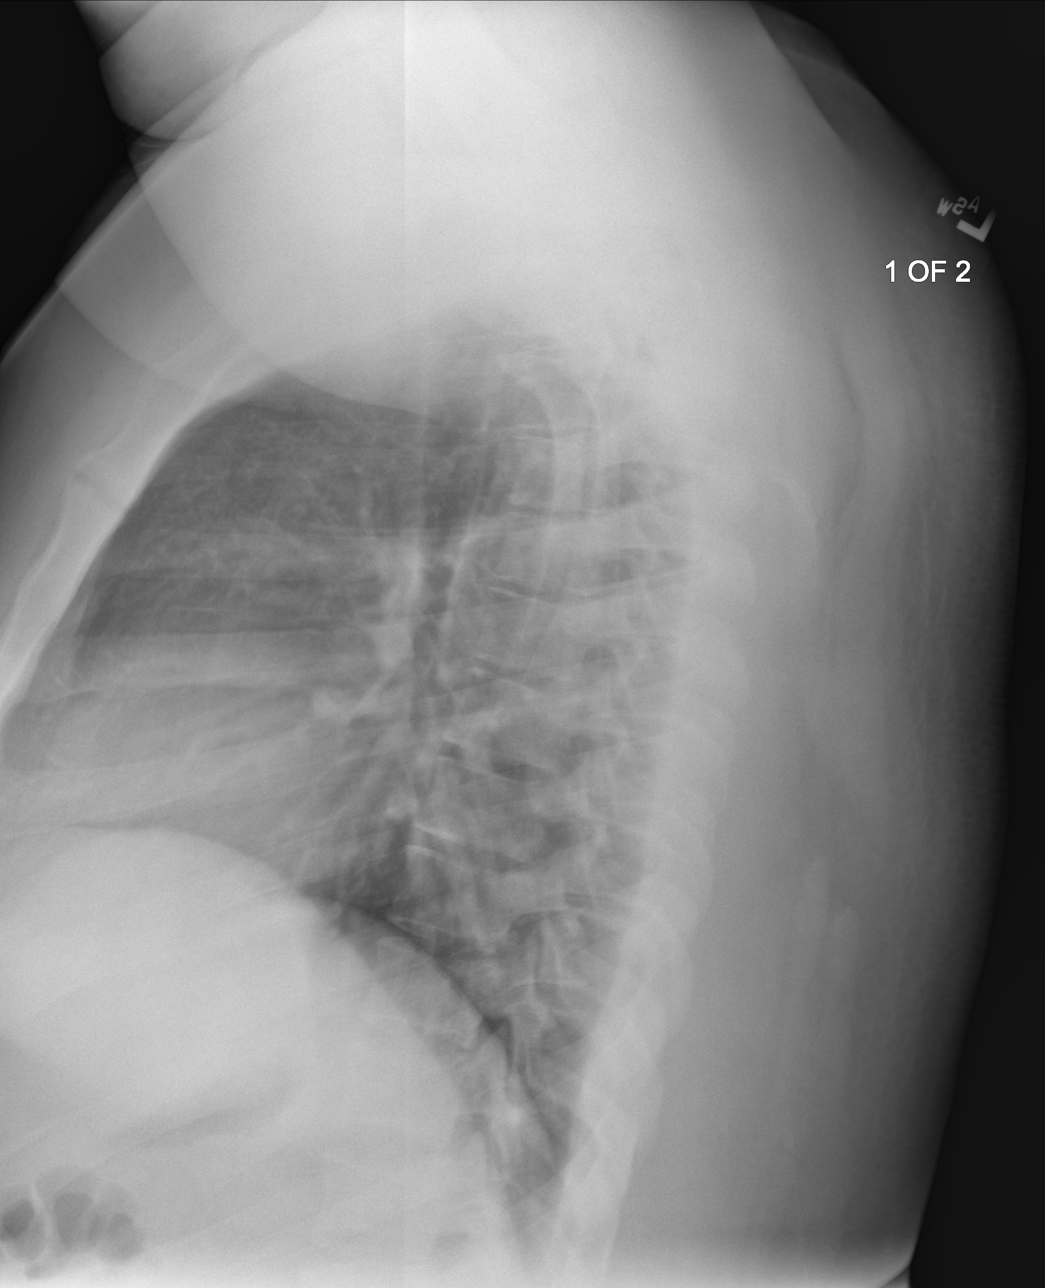

[chest pa (2 of 2)]
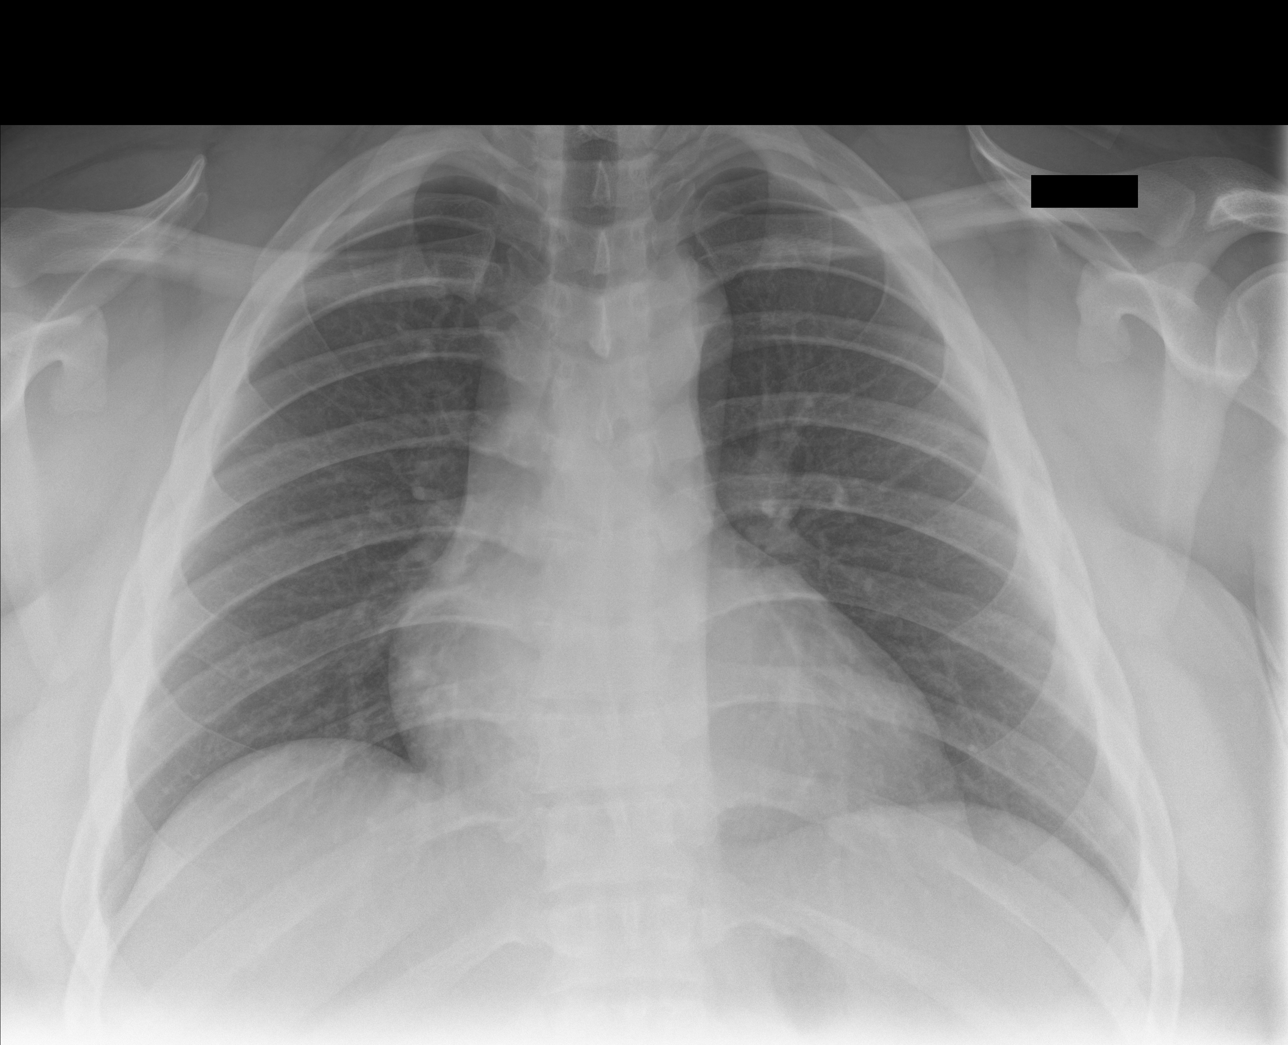

[chest lat (2 of 2)]
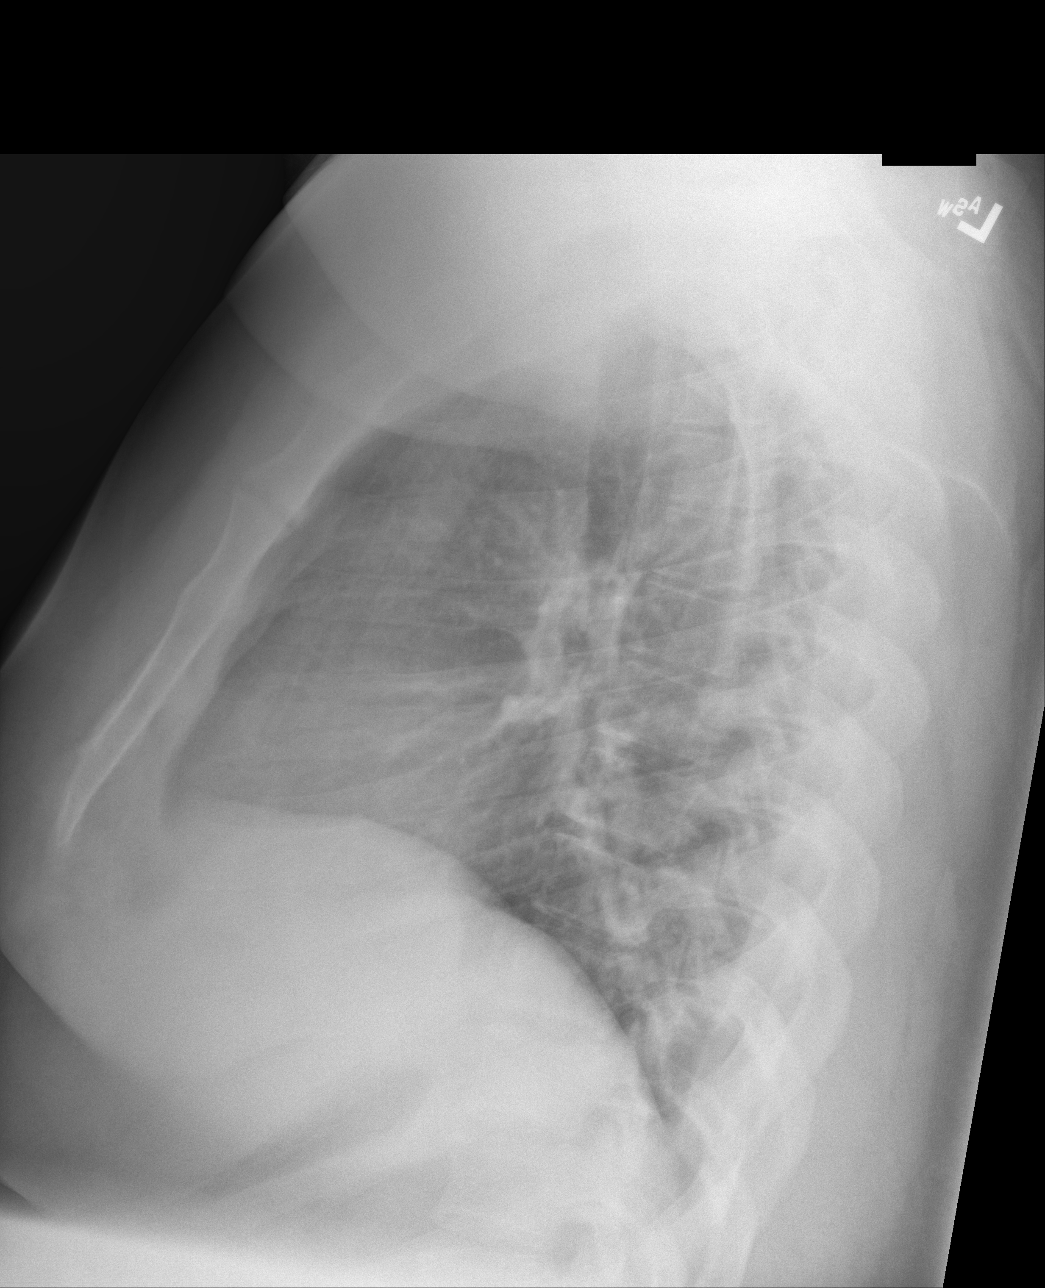

[4 of 4 positions shown; findings below may reference images not displayed]

FINDINGS: Initial low lung volumes improved on repeat imaging. Upper normal
heart size. No focal airspace disease. No pulmonary edema. No
pleural fluid or pneumothorax. No acute osseous abnormalities are
seen. Detailed assessment limited by soft tissue attenuation from
habitus.
IMPRESSION: Low lung volumes without acute abnormality.

## 2023-02-22 ENCOUNTER — Ambulatory Visit (INDEPENDENT_AMBULATORY_CARE_PROVIDER_SITE_OTHER): Payer: Self-pay

## 2023-02-22 ENCOUNTER — Ambulatory Visit
Admission: EM | Admit: 2023-02-22 | Discharge: 2023-02-22 | Disposition: A | Discharge status: Home / Self Care | Payer: Medicaid Other | Attending: Emergency Medicine | Admitting: Emergency Medicine

## 2023-02-22 DIAGNOSIS — I160 Hypertensive urgency: Secondary | ICD-10-CM

## 2023-02-22 DIAGNOSIS — U071 COVID-19: Secondary | ICD-10-CM

## 2023-02-22 LAB — BASIC METABOLIC PANEL
Anion gap: 9 (ref 5–15)
BUN: 12 mg/dL (ref 6–20)
CO2: 25 mmol/L (ref 22–32)
Calcium: 9 mg/dL (ref 8.9–10.3)
Chloride: 101 mmol/L (ref 98–111)
Creatinine, Ser: 0.99 mg/dL (ref 0.61–1.24)
GFR, Estimated: >60 mL/min (ref >60)
Glucose, Bld: 115 mg/dL — ABNORMAL HIGH (ref 70–99)
Potassium: 3.4 mmol/L — ABNORMAL LOW (ref 3.5–5.1)
Sodium: 135 mmol/L (ref 135–145)

## 2023-02-22 LAB — SARS CORONAVIRUS 2 BY RT PCR: SARS Coronavirus 2 by RT PCR: POSITIVE — AB

## 2023-02-22 MED ORDER — NIRMATRELVIR/RITONAVIR (PAXLOVID)TABLET: 3.0000 | ORAL_TABLET | Freq: Two times a day (BID) | ORAL | 0 refills | Status: AC

## 2023-02-22 MED ORDER — IPRATROPIUM BROMIDE 0.06 % NA SOLN: 2.0000 | Freq: Four times a day (QID) | NASAL | 12 refills | Status: AC

## 2023-02-22 MED ORDER — BENZONATATE 100 MG PO CAPS: 200.0000 mg | ORAL_CAPSULE | Freq: Three times a day (TID) | ORAL | 0 refills | Status: AC

## 2023-02-22 MED ORDER — PROMETHAZINE-DM 6.25-15 MG/5ML PO SYRP: 5.0000 mL | ORAL_SOLUTION | Freq: Four times a day (QID) | ORAL | 0 refills | Status: AC | PRN

## 2023-02-22 MED ORDER — AMLODIPINE BESYLATE 5 MG PO TABS: 5.0000 mg | ORAL_TABLET | Freq: Every day | ORAL | 3 refills | Status: AC

## 2023-02-22 MED ORDER — HYDROCHLOROTHIAZIDE 25 MG PO TABS: 25.0000 mg | ORAL_TABLET | Freq: Every day | ORAL | 2 refills | Status: AC

## 2023-02-22 NOTE — Discharge Instructions (Addendum)
CDC guidelines state that you must wear a mask for the first 5 days of symptoms when you are around other people.  After 5 days you no longer need to mask as you are no longer considered infectious.  There is no longer need to quarantine unless you have a fever.  If you do have a fever then you need to quarantine until you have been fever free for 24 hours without taking Tylenol and/or ibuprofen.  Use over-the-counter Tylenol and/or ibuprofen according to the package instructions as needed for fever and pain.  Use the Atrovent nasal spray, 2 squirts up each nostril every 6 hours, as needed for nasal congestion and runny nose.  Use the Tessalon Perles every 8 hours during the day as needed for cough.  Take them with a small sip of water.  You may experience numbness to the base of your tongue or metallic taste in her mouth, this is normal.  Use the Promethazine DM cough syrup at bedtime as needed for cough and congestion.  Be mindful this medication will make you sleepy.  If you have been prescribed Paxlovid take it twice daily as prescribed for 5 days.  If you develop any worsening respiratory symptoms such as shortness of breath, shortness of breath at rest, feel as though you cannot catch your breath, you are unable to speak in full sentences, or, as a late sign, your lips begin turning blue you need to call 911 and go to the ER for evaluation.   For your blood pressure when should restart the amlodipine 5 mg once daily and start taking the hydrochlorothiazide 25 mg daily.  Start that medication tomorrow morning.  You may start the amlodipine tonight.

## 2023-02-22 NOTE — ED Provider Notes (Signed)
[facility]    CSN: [id number] Arrival date & time: 02/22/23  1242      History   Chief Complaint Chief Complaint  Patient presents with   Cough    HPI Brian Mcknight is a 22 y.o. male.   HPI  15 old male with a past medical history of morbid obesity, OSA on CPAP, essential hypertension, and acute respiratory failure secondary to COVID presents for evaluation of chest pain and shortness of breath that started last night along with a cough.  This morning he reports that he has had 2 episodes of bloody sputum that he is coughed up.  He also endorses runny nose, nasal congestion, and sore throat.  His chest pain is on the center of his chest with radiation to the right shoulder that he describes as a constant sharp pain and he is rating it as a 7/10.  He has not had any fever at home and he denies any known COVID exposure.  He is not a smoker and he does not have a history of blood clots.  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 Authorizing Provider  benzonatate (TESSALON) 100 MG capsule Take 2 capsules (200 mg total) by mouth every 8 (eight) hours. 02/22/23  Yes Becky Augusta, NP  hydrochlorothiazide (HYDRODIURIL) 25 MG tablet Take 1 tablet (25 mg  total) by mouth daily. 02/22/23  Yes Becky Augusta, NP  ipratropium (ATROVENT) 0.06 % nasal spray Place 2 sprays into both nostrils 4 (four) times daily. 02/22/23  Yes Becky Augusta, NP  nirmatrelvir/ritonavir (PAXLOVID) 20 x 150 MG & 10 x 100MG  TABS Take 3 tablets by mouth 2 (two) times daily for 5 days. 02/22/23 02/27/23 Yes Becky Augusta, NP  promethazine-dextromethorphan (PROMETHAZINE-DM) 6.25-15 MG/5ML syrup Take 5 mLs by mouth 4 (four) times daily as needed. 02/22/23  Yes Becky Augusta, NP  albuterol (VENTOLIN HFA) 108 (90 Base) MCG/ACT inhaler Inhale 2 puffs into the lungs every 6 (six) hours as needed for wheezing or shortness of breath. 11/28/19 12/28/19  Charise Killian, MD  amLODipine (NORVASC) 5 MG tablet Take 1 tablet (5 mg total) by mouth daily. 02/22/23 03/24/23  Becky Augusta, NP  ibuprofen (ADVIL) 800 MG tablet Take 800 mg by mouth every 6 (six) hours as needed for pain. 11/14/19   [provider]  Vitamin D, Ergocalciferol, (DRISDOL) 1.25 MG (50000 UNIT) CAPS capsule Take 50,000 Units by mouth once a week. 09/13/19   [provider]    Family History Family History  Problem Relation Age of Onset   Healthy Mother    Drug abuse Father     Social History Social History   Tobacco Use  Smoking status: Never   Smokeless tobacco: Never  Vaping Use   Vaping status: Never Used  Substance Use Topics   Alcohol use: Yes    Comment: 3 times per month   Drug use: Never     Allergies   Coconut fatty acids   Review of Systems Review of Systems  Constitutional:  Negative for fever.  HENT:  Positive for congestion, rhinorrhea and sore throat. Negative for ear pain.   Respiratory:  Positive for cough and shortness of breath.   Cardiovascular:  Positive for chest pain.     Physical Exam Triage Vital Signs ED Triage Vitals  Encounter Vitals Group     BP      Systolic BP Percentile      Diastolic BP Percentile      Pulse      Resp      Temp      Temp src       SpO2      Weight      Height      Head Circumference      Peak Flow      Pain Score      Pain Loc      Pain Education      Exclude from Growth Chart    No data found.  Updated Vital Signs BP (!) 175/112 (BP Location: Left Arm)   Pulse 98   Temp 99.6 F (37.6 C) (Oral)   Resp (!) 22   SpO2 98%   Visual Acuity Right Eye Distance:   Left Eye Distance:   Bilateral Distance:    Right Eye Near:   Left Eye Near:    Bilateral Near:     Physical Exam Vitals and nursing note reviewed.  Constitutional:      Appearance: Normal appearance. He is obese. He is not ill-appearing.  HENT:     Head: Normocephalic and atraumatic.     Right Ear: Tympanic membrane, ear canal and external ear normal. There is no impacted cerumen.     Left Ear: Tympanic membrane, ear canal and external ear normal. There is no impacted cerumen.     Nose: Congestion and rhinorrhea present.     Comments: Nasal mucosa is erythematous and edematous with scant clear discharge in both nares.    Mouth/Throat:     Mouth: Mucous membranes are moist.     Pharynx: Oropharynx is clear. Posterior oropharyngeal erythema present.     Comments: Posterior oropharynx is erythematous. Cardiovascular:     Rate and Rhythm: Normal rate and regular rhythm.     Pulses: Normal pulses.     Heart sounds: Normal heart sounds. No murmur heard.    No friction rub. No gallop.  Pulmonary:     Effort: Pulmonary effort is normal.     Breath sounds: Normal breath sounds. No wheezing, rhonchi or rales.  Musculoskeletal:     Cervical back: Normal range of motion and neck supple.  Lymphadenopathy:     Cervical: No cervical adenopathy.  Skin:    General: Skin is warm and dry.     Capillary Refill: Capillary refill takes less than 2 seconds.     Findings: No rash.  Neurological:     General: No focal deficit present.     Mental Status: He is alert and oriented to person, place, and time.      UC Treatments / Results  Labs (all  labs ordered are listed, but only abnormal results are displayed) Labs  Reviewed  SARS CORONAVIRUS 2 BY RT PCR - Abnormal; Notable for the following components:      Result Value   SARS Coronavirus 2 by RT PCR POSITIVE (*)    All other components within normal limits  BASIC METABOLIC PANEL - Abnormal; Notable for the following components:   Potassium 3.4 (*)    Glucose, Bld 115 (*)    All other components within normal limits    EKG Normal sinus rhythm with a ventricular rate of 92 bpm PR normal 192 ms QRS duration 86 ms QT/QTc 342/422 ms No ST or T wave abnormalities.  Radiology DG Chest 2 View  Result Date: 02/22/2023 CLINICAL DATA:  Chest pain. EXAM: CHEST - 2 VIEW COMPARISON:  July 23, 2020. FINDINGS: The heart size and mediastinal contours are within normal limits. Both lungs are clear. The visualized skeletal structures are unremarkable. IMPRESSION: No active cardiopulmonary disease. Electronically Signed   By: Lupita Raider M.D.   On: 02/22/2023 14:26    Procedures Procedures (including critical care time)  Medications Ordered in UC Medications - No data to display  Initial Impression / Assessment and Plan / UC Course  I have reviewed the triage vital signs and the nursing notes.  Pertinent labs & imaging results that were available during my care of the patient were reviewed by me and considered in my medical decision making (see chart for details).   Patient is a pleasant 22 year old male presenting for evaluation of chest pain, shortness breath, and cough with episodes of hemoptysis.  Symptoms began last night.  He is able to speak in full sentence without dyspnea or tachypnea.  He does have a mildly elevated temp of 99.6 here in clinic.  Respiratory rate is 22 but room oxygen saturation is 98%.  Pulse is normal at 98.  Patient is hypertensive with a blood pressure 175/112.  He is prescribed amlodipine 5 mg but states he has not taken it in over a month as his  brother took all of his amlodipine in a suicide attempt and he has not bothered to get a refill.  Patient's EKG at triage shows normal sinus rhythm with a rate of 92 bpm but no T wave or ST abnormalities.  Previous EKG from May 2021 showed ST depression in lead III, V5, and V6.  Those changes are not present on current EKG.  Patient does not have a history of blood clots and he denies any recent travel.  With his mixture of upper or lower respiratory symptoms there is definitely concern for COVID, PE, pneumonia.  I will order a COVID PCR along with a chest x-ray to evaluate for any acute cardiopulmonary pathology.  Chest x-ray independently reviewed and evaluated by me.  Impression:.  Patient has enlarged cardiac silhouette but there is no evidence of infiltrate or effusion.  Lateral is poor quality secondary to patient's body habitus.  Radiology overread is pending. Radiology impression chest x-ray states no active cardiopulmonary disease.  COVID PCR is positive.  I will check BMP to evaluate patient's renal function prior to prescribing Paxlovid.  For treatment of patient's COVID-19.  Will also restart him on his amlodipine for his high blood pressure as well as add hydrochlorothiazide 25 mg daily.  Atrovent nasal spray for nasal congestion 1 month Tessalon Perles and Promethazine DM cough syrup.  Strict ER precautions reviewed given that patient has had previous height hypoxia and respiratory failure as result of COVID-19.  I will also have staff establish a  primary care provider for the patient at discharge.  Final Clinical Impressions(s) / UC Diagnoses   Final diagnoses:  COVID-19  Hypertensive urgency     Discharge Instructions      CDC guidelines state that you must wear a mask for the first 5 days of symptoms when you are around other people.  After 5 days you no longer need to mask as you are no longer considered infectious.  There is no longer need to quarantine unless you have a fever.   If you do have a fever then you need to quarantine until you have been fever free for 24 hours without taking Tylenol and/or ibuprofen.  Use over-the-counter Tylenol and/or ibuprofen according to the package instructions as needed for fever and pain.  Use the Atrovent nasal spray, 2 squirts up each nostril every 6 hours, as needed for nasal congestion and runny nose.  Use the Tessalon Perles every 8 hours during the day as needed for cough.  Take them with a small sip of water.  You may experience numbness to the base of your tongue or metallic taste in her mouth, this is normal.  Use the Promethazine DM cough syrup at bedtime as needed for cough and congestion.  Be mindful this medication will make you sleepy.  If you have been prescribed Paxlovid take it twice daily as prescribed for 5 days.  If you develop any worsening respiratory symptoms such as shortness of breath, shortness of breath at rest, feel as though you cannot catch your breath, you are unable to speak in full sentences, or, as a late sign, your lips begin turning blue you need to call 911 and go to the ER for evaluation.   For your blood pressure when should restart the amlodipine 5 mg once daily and start taking the hydrochlorothiazide 25 mg daily.  Start that medication tomorrow morning.  You may start the amlodipine tonight.     ED Prescriptions     Medication Sig Dispense Auth. Provider   amLODipine (NORVASC) 5 MG tablet Take 1 tablet (5 mg total) by mouth daily. 30 tablet Becky Augusta, NP   hydrochlorothiazide (HYDRODIURIL) 25 MG tablet Take 1 tablet (25 mg total) by mouth daily. 30 tablet Becky Augusta, NP   benzonatate (TESSALON) 100 MG capsule Take 2 capsules (200 mg total) by mouth every 8 (eight) hours. 21 capsule Becky Augusta, NP   ipratropium (ATROVENT) 0.06 % nasal spray Place 2 sprays into both nostrils 4 (four) times daily. 15 mL Becky Augusta, NP   promethazine-dextromethorphan (PROMETHAZINE-DM) 6.25-15  MG/5ML syrup Take 5 mLs by mouth 4 (four) times daily as needed. 118 mL Becky Augusta, NP   nirmatrelvir/ritonavir (PAXLOVID) 20 x 150 MG & 10 x 100MG  TABS Take 3 tablets by mouth 2 (two) times daily for 5 days. 30 tablet Becky Augusta, NP      PDMP not reviewed this encounter.   Becky Augusta, NP 02/22/23 1434

## 2023-02-22 NOTE — ED Triage Notes (Signed)
Patient presents to UC for chest pain when coughing and taking a deep breath since last night. SOB of breath this morning. States he has also had 2 episodes of blood colored sputum today about 1 hr ago. States about a quarter size amount. Has not taken any OTC meds.

## 2023-04-05 ENCOUNTER — Other Ambulatory Visit: Payer: Self-pay

## 2023-04-05 ENCOUNTER — Ambulatory Visit: Payer: Self-pay | Admitting: Physician Assistant
